# Patient Record
Sex: Female | Born: 1975 | Race: White | Hispanic: Yes | Marital: Married | Smoking: Never smoker
Health system: Southern US, Community
[De-identification: ages and names within clinical notes are randomized; demographics above are authoritative.]

## PROBLEM LIST (undated history)

## (undated) ENCOUNTER — Inpatient Hospital Stay (HOSPITAL_COMMUNITY): Payer: Self-pay

## (undated) DIAGNOSIS — N883 Incompetence of cervix uteri: Secondary | ICD-10-CM

## (undated) DIAGNOSIS — J302 Other seasonal allergic rhinitis: Secondary | ICD-10-CM

## (undated) HISTORY — PX: CHOLECYSTECTOMY: SHX55

---

## 2008-05-31 ENCOUNTER — Emergency Department (HOSPITAL_COMMUNITY): Admission: EM | Admit: 2008-05-31 | Discharge: 2008-05-31 | Payer: Self-pay | Admitting: Emergency Medicine

## 2010-01-28 ENCOUNTER — Ambulatory Visit: Payer: Self-pay | Admitting: Gastroenterology

## 2010-01-28 ENCOUNTER — Inpatient Hospital Stay (HOSPITAL_COMMUNITY): Admission: EM | Admit: 2010-01-28 | Discharge: 2010-01-29 | Payer: Self-pay | Admitting: Emergency Medicine

## 2010-01-29 ENCOUNTER — Ambulatory Visit: Payer: Self-pay | Admitting: Internal Medicine

## 2010-05-05 ENCOUNTER — Inpatient Hospital Stay (HOSPITAL_COMMUNITY): Admission: EM | Admit: 2010-05-05 | Discharge: 2010-05-07 | Payer: Self-pay | Admitting: Emergency Medicine

## 2010-05-06 ENCOUNTER — Encounter (INDEPENDENT_AMBULATORY_CARE_PROVIDER_SITE_OTHER): Payer: Self-pay | Admitting: General Surgery

## 2010-06-02 ENCOUNTER — Emergency Department (HOSPITAL_COMMUNITY): Admission: EM | Admit: 2010-06-02 | Discharge: 2010-06-02 | Payer: Self-pay | Admitting: Emergency Medicine

## 2010-06-15 ENCOUNTER — Emergency Department (HOSPITAL_COMMUNITY): Admission: EM | Admit: 2010-06-15 | Discharge: 2010-06-15 | Payer: Self-pay | Admitting: Emergency Medicine

## 2010-11-17 LAB — BASIC METABOLIC PANEL
Calcium: 8.9 mg/dL (ref 8.4–10.5)
Chloride: 107 mEq/L (ref 96–112)
Creatinine, Ser: 0.52 mg/dL (ref 0.4–1.2)
GFR calc non Af Amer: >60 mL/min (ref >60)
Glucose, Bld: 81 mg/dL (ref 70–99)
Potassium: 3.7 mEq/L (ref 3.5–5.1)

## 2010-11-17 LAB — CBC
HCT: 35.9 % — ABNORMAL LOW (ref 36.0–46.0)
HCT: 38.4 % (ref 36.0–46.0)
Hemoglobin: 11.9 g/dL — ABNORMAL LOW (ref 12.0–15.0)
Hemoglobin: 13 g/dL (ref 12.0–15.0)
MCH: 28.6 pg (ref 26.0–34.0)
MCH: 28.9 pg (ref 26.0–34.0)
MCV: 86.2 fL (ref 78.0–100.0)
MCV: 85.6 fL (ref 78.0–100.0)
MCV: 86.8 fL (ref 78.0–100.0)
Platelets: 255 10*3/uL (ref 150–400)
RBC: 4.13 MIL/uL (ref 3.87–5.11)
WBC: 10.9 10*3/uL — ABNORMAL HIGH (ref 4.0–10.5)
WBC: 9.8 10*3/uL (ref 4.0–10.5)

## 2010-11-17 LAB — URINALYSIS, ROUTINE W REFLEX MICROSCOPIC
Bilirubin Urine: NEGATIVE
Glucose, UA: NEGATIVE mg/dL
Hgb urine dipstick: NEGATIVE
Ketones, ur: NEGATIVE mg/dL
pH: 5.5 (ref 5.0–8.0)

## 2010-11-17 LAB — COMPREHENSIVE METABOLIC PANEL
ALT: 15 U/L (ref 0–35)
AST: 14 U/L (ref 0–37)
AST: 20 U/L (ref 0–37)
Alkaline Phosphatase: 53 U/L (ref 39–117)
Alkaline Phosphatase: 50 U/L (ref 39–117)
BUN: 11 mg/dL (ref 6–23)
CO2: 23 mEq/L (ref 19–32)
CO2: 22 mEq/L (ref 19–32)
Calcium: 9 mg/dL (ref 8.4–10.5)
Chloride: 108 mEq/L (ref 96–112)
Creatinine, Ser: 0.45 mg/dL (ref 0.4–1.2)
Creatinine, Ser: 0.48 mg/dL (ref 0.4–1.2)
GFR calc non Af Amer: >60 mL/min (ref >60)
Potassium: 3.3 mEq/L — ABNORMAL LOW (ref 3.5–5.1)
Total Bilirubin: 0.7 mg/dL (ref 0.3–1.2)
Total Bilirubin: 0.4 mg/dL (ref 0.3–1.2)
Total Protein: 7.6 g/dL (ref 6.0–8.3)

## 2010-11-17 LAB — DIFFERENTIAL
Basophils Absolute: 0.2 10*3/uL — ABNORMAL HIGH (ref 0.0–0.1)
Basophils Relative: 1 % (ref 0–1)
Eosinophils Absolute: 0.2 10*3/uL (ref 0.0–0.7)
Eosinophils Relative: 3 % (ref 0–5)
Lymphocytes Relative: 30 % (ref 12–46)
Lymphocytes Relative: 43 % (ref 12–46)
Lymphs Abs: 3.7 10*3/uL (ref 0.7–4.0)
Lymphs Abs: 3.8 10*3/uL (ref 0.7–4.0)
Lymphs Abs: 4.7 10*3/uL — ABNORMAL HIGH (ref 0.7–4.0)
Monocytes Absolute: 0.5 10*3/uL (ref 0.1–1.0)
Monocytes Relative: 4 % (ref 3–12)
Monocytes Relative: 4 % (ref 3–12)
Monocytes Relative: 4 % (ref 3–12)
Neutro Abs: 8 10*3/uL — ABNORMAL HIGH (ref 1.7–7.7)
Neutrophils Relative %: 50 % (ref 43–77)
Neutrophils Relative %: 55 % (ref 43–77)

## 2010-11-17 LAB — PREGNANCY, URINE: Preg Test, Ur: NEGATIVE

## 2010-11-17 LAB — MRSA PCR SCREENING: MRSA by PCR: NEGATIVE

## 2010-11-17 LAB — POCT PREGNANCY, URINE: Preg Test, Ur: NEGATIVE

## 2010-11-21 LAB — COMPREHENSIVE METABOLIC PANEL
AST: 766 U/L — ABNORMAL HIGH (ref 0–37)
Albumin: 3.2 g/dL — ABNORMAL LOW (ref 3.5–5.2)
Albumin: 4.1 g/dL (ref 3.5–5.2)
BUN: 7 mg/dL (ref 6–23)
Chloride: 109 mEq/L (ref 96–112)
Creatinine, Ser: 0.6 mg/dL (ref 0.4–1.2)
Glucose, Bld: 105 mg/dL — ABNORMAL HIGH (ref 70–99)
Sodium: 139 mEq/L (ref 135–145)
Total Bilirubin: 0.5 mg/dL (ref 0.3–1.2)
Total Bilirubin: 1 mg/dL (ref 0.3–1.2)
Total Protein: 5.7 g/dL — ABNORMAL LOW (ref 6.0–8.3)

## 2010-11-21 LAB — ANA: Anti Nuclear Antibody(ANA): NEGATIVE

## 2010-11-21 LAB — WET PREP, GENITAL: Yeast Wet Prep HPF POC: NONE SEEN

## 2010-11-21 LAB — CBC
HCT: 32.1 % — ABNORMAL LOW (ref 36.0–46.0)
Hemoglobin: 14.5 g/dL (ref 12.0–15.0)
MCHC: 33.8 g/dL (ref 30.0–36.0)
MCV: 84.9 fL (ref 78.0–100.0)
MCV: 86.3 fL (ref 78.0–100.0)
Platelets: 238 10*3/uL (ref 150–400)
RBC: 5.04 MIL/uL (ref 3.87–5.11)
RDW: 14 % (ref 11.5–15.5)
RDW: 14.1 % (ref 11.5–15.5)
WBC: 16.7 10*3/uL — ABNORMAL HIGH (ref 4.0–10.5)

## 2010-11-21 LAB — URINALYSIS, ROUTINE W REFLEX MICROSCOPIC
Bilirubin Urine: NEGATIVE
Nitrite: NEGATIVE
Specific Gravity, Urine: 1.01 (ref 1.005–1.030)
Urobilinogen, UA: 0.2 mg/dL (ref 0.0–1.0)

## 2010-11-21 LAB — CULTURE, BLOOD (ROUTINE X 2)
Report Status: 6012011
Report Status: 6012011

## 2010-11-21 LAB — URINE MICROSCOPIC-ADD ON

## 2010-11-21 LAB — DIFFERENTIAL
Basophils Absolute: 0 10*3/uL (ref 0.0–0.1)
Lymphocytes Relative: 23 % (ref 12–46)
Lymphocytes Relative: 8 % — ABNORMAL LOW (ref 12–46)
Monocytes Absolute: 0.2 10*3/uL (ref 0.1–1.0)
Monocytes Absolute: 0.6 10*3/uL (ref 0.1–1.0)
Monocytes Relative: 4 % (ref 3–12)
Neutro Abs: 15.1 10*3/uL — ABNORMAL HIGH (ref 1.7–7.7)
Neutro Abs: 9.8 10*3/uL — ABNORMAL HIGH (ref 1.7–7.7)
Neutrophils Relative %: 72 % (ref 43–77)
Neutrophils Relative %: 91 % — ABNORMAL HIGH (ref 43–77)

## 2010-11-21 LAB — HEPATITIS PANEL, ACUTE
HCV Ab: NEGATIVE
Hep A IgM: NEGATIVE
Hep B C IgM: NEGATIVE
Hepatitis B Surface Ag: NEGATIVE

## 2010-11-21 LAB — ACETAMINOPHEN LEVEL: Acetaminophen (Tylenol), Serum: <10 ug/mL — ABNORMAL LOW (ref 10–30)

## 2010-11-21 LAB — FERRITIN: Ferritin: 85 ng/mL (ref 10–291)

## 2010-11-21 LAB — IRON AND TIBC: Iron: 119 ug/dL (ref 42–135)

## 2010-11-21 LAB — HEMOGLOBIN A1C
Hgb A1c MFr Bld: 5.4 % (ref <5.7)
Mean Plasma Glucose: 108 mg/dL (ref <117)

## 2010-11-21 LAB — PREGNANCY, URINE: Preg Test, Ur: NEGATIVE

## 2011-07-23 ENCOUNTER — Other Ambulatory Visit: Payer: Self-pay

## 2011-07-23 ENCOUNTER — Emergency Department (HOSPITAL_COMMUNITY)
Admission: EM | Admit: 2011-07-23 | Discharge: 2011-07-23 | Disposition: A | Discharge status: Home / Self Care | Payer: Self-pay | Attending: Emergency Medicine | Admitting: Emergency Medicine

## 2011-07-23 DIAGNOSIS — F1010 Alcohol abuse, uncomplicated: Secondary | ICD-10-CM | POA: Insufficient documentation

## 2011-07-23 DIAGNOSIS — R112 Nausea with vomiting, unspecified: Secondary | ICD-10-CM | POA: Insufficient documentation

## 2011-07-23 HISTORY — DX: Other seasonal allergic rhinitis: J30.2

## 2011-07-23 MED ORDER — SODIUM CHLORIDE 0.9 % IV BOLUS (SEPSIS)
1000.0000 mL | Freq: Once | INTRAVENOUS | Status: AC
  Administered 2011-07-23: 1000 mL via INTRAVENOUS

## 2011-07-23 MED ORDER — ONDANSETRON HCL 4 MG/2ML IJ SOLN
4.0000 mg | Freq: Once | INTRAMUSCULAR | Status: AC
  Administered 2011-07-23: 4 mg via INTRAVENOUS
  Filled 2011-07-23: qty 2

## 2011-07-23 MED ORDER — PANTOPRAZOLE SODIUM 40 MG IV SOLR
40.0000 mg | Freq: Once | INTRAVENOUS | Status: AC
  Administered 2011-07-23: 40 mg via INTRAVENOUS
  Filled 2011-07-23: qty 40

## 2011-07-23 MED ORDER — SODIUM CHLORIDE 0.9 % IV SOLN
Freq: Once | INTRAVENOUS | Status: AC
  Administered 2011-07-23: 05:00:00 via INTRAVENOUS

## 2011-07-23 MED ORDER — PROMETHAZINE HCL 25 MG PO TABS: 12.5000 mg | ORAL_TABLET | Freq: Four times a day (QID) | ORAL | Status: AC | PRN

## 2011-07-23 NOTE — ED Notes (Signed)
Pt sleeping. 

## 2011-07-23 NOTE — ED Provider Notes (Signed)
History     CSN: [id number] Arrival date & time: 07/23/2011  4:23 AM   First MD Initiated Contact with Patient 07/23/11 0451      Chief Complaint  Patient presents with  . Emesis    pressure on throat and chest    (Consider location/radiation/quality/duration/timing/severity/associated sxs/prior treatment) HPI Comments: Leslie Braun is a 35 y.o. female who presents to the Emergency Department complaining of vomiting. Patient drinks every Saturday night. Tonight she drank 8-10 beers and began to feel sick on her stomach. Began vomiting and has been unable to stop. Now with central chest pain that radiates up to his neck and throat. Patient is tearful.  Patient is a 35 y.o. female presenting with vomiting. The history is provided by the patient.  Emesis  This is a new problem. The current episode started 3 to 5 hours ago. The problem occurs 5 to 10 times per day. The problem has not changed since onset.The emesis has an appearance of stomach contents. There has been no fever. Pertinent negatives include no abdominal pain, no chills and no fever.    Past Medical History  Diagnosis Date  . Seasonal allergies     Past Surgical History  Procedure Date  . Cholecystectomy     History reviewed. No pertinent family history.  History  Substance Use Topics  . Smoking status: Never Smoker   . Smokeless tobacco: Not on file  . Alcohol Use: Yes     heavy    OB History    Grav Para Term Preterm Abortions TAB SAB Ect Mult Living                  Review of Systems  Constitutional: Negative for fever and chills.  Gastrointestinal: Positive for nausea and vomiting. Negative for abdominal pain.  Psychiatric/Behavioral:       Tearful  All other systems reviewed and are negative.    Allergies  Review of patient's allergies indicates no known allergies.  Home Medications  No current outpatient prescriptions on file.  BP 128/103  Pulse 102  Temp(Src) 97.7 F (36.5 C) (Oral)   Resp 22  Ht 5\' 3"  (1.6 m)  Wt 200 lb (90.719 kg)  BMI 35.43 kg/m2  SpO2 100%  LMP 06/19/2011  Physical Exam  Nursing note and vitals reviewed. Constitutional: She is oriented to person, place, and time. She appears well-developed and well-nourished. She appears distressed.       intoxicated  HENT:  Head: Normocephalic and atraumatic.  Right Ear: External ear normal.  Left Ear: External ear normal.  Mouth/Throat: Oropharynx is clear and moist.  Eyes: EOM are normal.  Neck: Normal range of motion.  Cardiovascular: Normal rate, normal heart sounds and intact distal pulses.   Pulmonary/Chest: Effort normal and breath sounds normal.  Abdominal: Soft. Bowel sounds are normal. She exhibits no distension. There is no tenderness. There is no rebound and no guarding.  Musculoskeletal: Normal range of motion.  Neurological: She is oriented to person, place, and time.  Skin: Skin is warm and dry.  Psychiatric:       tearful    ED Course  Procedures (including critical care time)  Date: 07/23/2011  0439  Rate: 95  Rhythm: normal sinus rhythm  QRS Axis: normal  Intervals: normal  ST/T Wave abnormalities: normal  Conduction Disutrbances:none  Narrative Interpretation:   Old EKG Reviewed: none available     MDM  Patient with nausea and vomiting after consuming alcohol. Received IVF, has taken PO  fluids. Nausea and vomiting resolved. Pt feels improved after observation and/or treatment in ED.The patient appears reasonably screened and/or stabilized for discharge and I doubt any other medical condition or other [facility] requiring further screening, evaluation, or treatment in the ED at this time prior to discharge.  MDM Reviewed: nursing note and vitals Interpretation: ECG          Nicoletta Dress. Colon Branch, MD 07/23/11 [phone]

## 2011-07-23 NOTE — ED Notes (Signed)
2nd liter infusing now

## 2011-07-23 NOTE — ED Notes (Signed)
Was out drinking at least 8 beers last night, has been vomiting since and began having "pressure on chest and throat closing up for 1 hour"

## 2011-07-23 NOTE — ED Notes (Signed)
Pt shivering, warm fluids given

## 2011-07-23 NOTE — ED Notes (Signed)
Pt also given warm blankets

## 2012-09-04 NOTE — L&D Delivery Note (Signed)
Delivery Note At 3:18 AM a viable female was delivered via Vaginal, Spontaneous Delivery (Presentation: Right Occiput Anterior).  APGAR: infant crying at perineum; weight .   Placenta status: Intact, Spontaneous.  Cord: 3 vessels with the following complications: None.    Anesthesia: None  Episiotomy: None Lacerations: None Suture Repair: NA Est. Blood Loss (mL): 200  Mom to postpartum.  Baby to NICU.  I was called to evaluate pt in Ante with worsening pain from MAU. Pt was writhing in bed q74minuts with poor toco tracing. Fetus was tolerating. Pt at that time was 8/90/+1. Pt urgently tranferred to L&D, and on recheck pt was complete. NICU was notified, once in the room pushed with pt. 2 pushes delivered. NSVD over intact perineum. Cord clamped, cut and handed to waiting NICU team. 3v cord and intact placenta delivered with active management of 3rd stage with pit and traction. No tears appreciated. Firm uterus, hemostatic. EBL 200cc. Infant with NICU. Mother likely to 3rd floor to be closer to infant.  1 dose of ampicillin in prior to delivery. Counts correct prior to leaving room    Leslie Braun 07/01/2013, 3:33 AM

## 2013-04-22 ENCOUNTER — Observation Stay (HOSPITAL_COMMUNITY)
Admission: AD | Admit: 2013-04-22 | Discharge: 2013-04-23 | Disposition: A | Discharge status: Home / Self Care | Payer: Medicaid Other | Source: Ambulatory Visit | Attending: Obstetrics & Gynecology | Admitting: Obstetrics & Gynecology

## 2013-04-22 ENCOUNTER — Encounter (HOSPITAL_COMMUNITY): Payer: Self-pay

## 2013-04-22 DIAGNOSIS — N390 Urinary tract infection, site not specified: Secondary | ICD-10-CM | POA: Insufficient documentation

## 2013-04-22 DIAGNOSIS — O2390 Unspecified genitourinary tract infection in pregnancy, unspecified trimester: Secondary | ICD-10-CM | POA: Insufficient documentation

## 2013-04-22 DIAGNOSIS — O09529 Supervision of elderly multigravida, unspecified trimester: Secondary | ICD-10-CM | POA: Insufficient documentation

## 2013-04-22 DIAGNOSIS — O2342 Unspecified infection of urinary tract in pregnancy, second trimester: Secondary | ICD-10-CM

## 2013-04-22 DIAGNOSIS — O209 Hemorrhage in early pregnancy, unspecified: Principal | ICD-10-CM | POA: Insufficient documentation

## 2013-04-22 DIAGNOSIS — O2340 Unspecified infection of urinary tract in pregnancy, unspecified trimester: Secondary | ICD-10-CM | POA: Diagnosis present

## 2013-04-22 DIAGNOSIS — N888 Other specified noninflammatory disorders of cervix uteri: Secondary | ICD-10-CM

## 2013-04-22 DIAGNOSIS — O4692 Antepartum hemorrhage, unspecified, second trimester: Secondary | ICD-10-CM

## 2013-04-22 DIAGNOSIS — O4690 Antepartum hemorrhage, unspecified, unspecified trimester: Secondary | ICD-10-CM | POA: Diagnosis present

## 2013-04-22 DIAGNOSIS — Z3689 Encounter for other specified antenatal screening: Secondary | ICD-10-CM

## 2013-04-22 DIAGNOSIS — O26879 Cervical shortening, unspecified trimester: Secondary | ICD-10-CM | POA: Insufficient documentation

## 2013-04-22 DIAGNOSIS — O3432 Maternal care for cervical incompetence, second trimester: Secondary | ICD-10-CM

## 2013-04-22 LAB — URINALYSIS, ROUTINE W REFLEX MICROSCOPIC
Hgb urine dipstick: NEGATIVE
Protein, ur: NEGATIVE mg/dL
Specific Gravity, Urine: >1.03 — ABNORMAL HIGH (ref 1.005–1.030)
Urobilinogen, UA: 0.2 mg/dL (ref 0.0–1.0)

## 2013-04-22 LAB — POCT PREGNANCY, URINE: Preg Test, Ur: POSITIVE — AB

## 2013-04-22 LAB — URINE MICROSCOPIC-ADD ON

## 2013-04-22 NOTE — MAU Note (Signed)
Pt states she also has a headache. Has not taken anything for it

## 2013-04-22 NOTE — MAU Note (Signed)
States she has not had a period in 4 months, about one month ago, pt had +hpt. At 400 am, pt had some vaginal bleeding when she wipes. Denies abdominal pain.

## 2013-04-22 NOTE — MAU Provider Note (Addendum)
Chief Complaint: Vaginal Bleeding and Headache  Chief Complaint: Vaginal Bleeding and Headache  First Provider Initiated Contact with Patient 04/23/13 0015     SUBJECTIVE HPI: Leslie Braun is a 37 y.o. G1P0 at 17 weeks 6 days by LMP who presents with vaginal bleeding with wiping today. Positive home pregnancy tests one month ago. No other testing this pregnancy.  Past Medical History  Diagnosis Date  . Seasonal allergies    OB History  Gravida Para Term Preterm AB SAB TAB Ectopic Multiple Living  3 2 2       2     # Outcome Date GA Lbr Len/2nd Weight Sex Delivery Anes PTL Lv  3 CUR           2 TRM      SVD   Y  1 TRM      SVD   Y     Past Surgical History  Procedure Laterality Date  . Cholecystectomy     History   Social History  . Marital Status: Married    Spouse Name: N/A    Number of Children: N/A  . Years of Education: N/A   Occupational History  . Not on file.   Social History Main Topics  . Smoking status: Never Smoker   . Smokeless tobacco: Not on file  . Alcohol Use: Yes     Comment: heavy  . Drug Use: No  . Sexual Activity: Yes    Birth Control/ Protection: None   Other Topics Concern  . Not on file   Social History Narrative  . No narrative on file   No current facility-administered medications on file prior to encounter.   No current outpatient prescriptions on file prior to encounter.   No Known Allergies  ROS: Positive for spotting, frequency of urination. Negative for fever, chills, vaginal discharge, abdominal pain, dysuria, urgency, flank pain, nausea, vomiting, diarrhea, constipation or passage of tissue.  OBJECTIVE Blood pressure 123/50, pulse 80, temperature 98.7 F (37.1 C), temperature source Oral, resp. rate 18, last menstrual period 12/17/2012, SpO2 100.00%. GENERAL: Well-developed, well-nourished female in no acute distress.  HEENT: Normocephalic HEART: normal rate RESP: normal effort ABDOMEN: Soft, non-tender. Fundal height  20 weeks. No CVA tenderness. EXTREMITIES: Nontender, no edema NEURO: Alert and oriented SPECULUM EXAM: NEFG, physiologic discharge, scant tan blood noted, cervix friable,appears closed. No mucopurulent discharge. Bimanual exam deferred do to unknown placentation. Fetal heart rate 152 by Doppler.  LAB RESULTS Results for orders placed during the hospital encounter of 04/22/13 (from the past 24 hour(s))  URINALYSIS, ROUTINE W REFLEX MICROSCOPIC     Status: Abnormal   Collection Time    04/22/13 11:35 PM      Result Value Range   Color, Urine YELLOW  YELLOW   APPearance CLEAR  CLEAR   Specific Gravity, Urine >1.030 (*) 1.005 - 1.030   pH 6.5  5.0 - 8.0   Glucose, UA NEGATIVE  NEGATIVE mg/dL   Hgb urine dipstick NEGATIVE  NEGATIVE   Bilirubin Urine NEGATIVE  NEGATIVE   Ketones, ur 15 (*) NEGATIVE mg/dL   Protein, ur NEGATIVE  NEGATIVE mg/dL   Urobilinogen, UA 0.2  0.0 - 1.0 mg/dL   Nitrite POSITIVE (*) NEGATIVE   Leukocytes, UA TRACE (*) NEGATIVE  URINE MICROSCOPIC-ADD ON     Status: Abnormal   Collection Time    04/22/13 11:35 PM      Result Value Range   Squamous Epithelial / LPF RARE  RARE   WBC,  UA 0-2  <3 WBC/hpf   RBC / HPF 0-2  <3 RBC/hpf   Bacteria, UA FEW (*) RARE   Crystals CA OXALATE CRYSTALS (*) NEGATIVE  POCT PREGNANCY, URINE     Status: Abnormal   Collection Time    04/22/13 11:37 PM      Result Value Range   Preg Test, Ur POSITIVE (*) NEGATIVE  ABO/RH     Status: None   Collection Time    04/23/13 12:20 AM      Result Value Range   ABO/RH(D) A POS    CBC WITH DIFFERENTIAL     Status: Abnormal   Collection Time    04/23/13 12:20 AM      Result Value Range   WBC 8.1  4.0 - 10.5 K/uL   RBC 3.51 (*) 3.87 - 5.11 MIL/uL   Hemoglobin 9.9 (*) 12.0 - 15.0 g/dL   HCT 09.8 (*) 11.9 - 14.7 %   MCV 82.9  78.0 - 100.0 fL   MCH 28.2  26.0 - 34.0 pg   MCHC 34.0  30.0 - 36.0 g/dL   RDW 82.9  56.2 - 13.0 %   Platelets 240  150 - 400 K/uL   Neutrophils Relative % 58   43 - 77 %   Neutro Abs 4.7  1.7 - 7.7 K/uL   Lymphocytes Relative 33  12 - 46 %   Lymphs Abs 2.6  0.7 - 4.0 K/uL   Monocytes Relative 5  3 - 12 %   Monocytes Absolute 0.4  0.1 - 1.0 K/uL   Eosinophils Relative 4  0 - 5 %   Eosinophils Absolute 0.3  0.0 - 0.7 K/uL   Basophils Relative 0  0 - 1 %   Basophils Absolute 0.0  0.0 - 0.1 K/uL  WET PREP, GENITAL     Status: Abnormal   Collection Time    04/23/13 12:26 AM      Result Value Range   Yeast Wet Prep HPF POC NONE SEEN  NONE SEEN   Trich, Wet Prep NONE SEEN  NONE SEEN   Clue Cells Wet Prep HPF POC FEW (*) NONE SEEN   WBC, Wet Prep HPF POC MODERATE (*) NONE SEEN   IMAGING No results found.  MAU COURSE  ASSESSMENT 1. Vaginal bleeding in pregnancy, second trimester   2. UTI in pregnancy, antepartum, second trimester   3. Cervix funneling complicating pregnancy, second trimester    PLAN Observe on third floor per consult with Dr. Otho Perl and Dr. Macon Large. Progesterone per vagina Macrobid. Urine culture and GC Chlamydia cultures pending. OB panel ordered.  [place], CNM 04/23/2013  2:51 AM    Attestation of Attending Supervision of Advanced Practitioner (PA/CNM/NP): Evaluation and management procedures were performed by the Advanced Practitioner under my supervision and collaboration.  I have reviewed the Advanced Practitioner's note and chart, and I agree with the management and plan.  [name], MD, FACOG Attending Obstetrician & Gynecologist Faculty Practice, [facility]

## 2013-04-23 ENCOUNTER — Observation Stay (HOSPITAL_COMMUNITY): Payer: Medicaid Other

## 2013-04-23 ENCOUNTER — Other Ambulatory Visit (HOSPITAL_COMMUNITY): Payer: Self-pay

## 2013-04-23 ENCOUNTER — Inpatient Hospital Stay (HOSPITAL_COMMUNITY): Payer: Medicaid Other

## 2013-04-23 ENCOUNTER — Encounter (HOSPITAL_COMMUNITY): Payer: Self-pay | Admitting: Advanced Practice Midwife

## 2013-04-23 DIAGNOSIS — O4690 Antepartum hemorrhage, unspecified, unspecified trimester: Secondary | ICD-10-CM

## 2013-04-23 DIAGNOSIS — O26879 Cervical shortening, unspecified trimester: Secondary | ICD-10-CM | POA: Diagnosis present

## 2013-04-23 DIAGNOSIS — O2340 Unspecified infection of urinary tract in pregnancy, unspecified trimester: Secondary | ICD-10-CM | POA: Diagnosis present

## 2013-04-23 LAB — CBC WITH DIFFERENTIAL/PLATELET
Eosinophils Relative: 4 % (ref 0–5)
Hemoglobin: 9.9 g/dL — ABNORMAL LOW (ref 12.0–15.0)
Lymphocytes Relative: 33 % (ref 12–46)
Lymphs Abs: 2.6 10*3/uL (ref 0.7–4.0)
MCV: 82.9 fL (ref 78.0–100.0)
Monocytes Relative: 5 % (ref 3–12)
Neutrophils Relative %: 58 % (ref 43–77)
Platelets: 240 10*3/uL (ref 150–400)
RBC: 3.51 MIL/uL — ABNORMAL LOW (ref 3.87–5.11)
WBC: 8.1 10*3/uL (ref 4.0–10.5)

## 2013-04-23 LAB — GC/CHLAMYDIA PROBE AMP
CT Probe RNA: NEGATIVE
GC Probe RNA: NEGATIVE

## 2013-04-23 LAB — RUBELLA SCREEN: Rubella: 5.86 Index — ABNORMAL HIGH (ref <0.90)

## 2013-04-23 LAB — RPR: RPR Ser Ql: NONREACTIVE

## 2013-04-23 LAB — WET PREP, GENITAL: Trich, Wet Prep: NONE SEEN

## 2013-04-23 MED ORDER — NITROFURANTOIN MONOHYD MACRO 100 MG PO CAPS
100.0000 mg | ORAL_CAPSULE | Freq: Two times a day (BID) | ORAL | Status: DC
  Administered 2013-04-23 (×2): 100 mg via ORAL
  Filled 2013-04-23 (×4): qty 1

## 2013-04-23 MED ORDER — DSS 100 MG PO CAPS: 100.0000 mg | ORAL_CAPSULE | Freq: Every day | ORAL | Status: DC

## 2013-04-23 MED ORDER — CALCIUM CARBONATE ANTACID 500 MG PO CHEW: 2.0000 | CHEWABLE_TABLET | ORAL | Status: DC | PRN

## 2013-04-23 MED ORDER — ZOLPIDEM TARTRATE 5 MG PO TABS: 5.0000 mg | ORAL_TABLET | Freq: Every evening | ORAL | Status: DC | PRN

## 2013-04-23 MED ORDER — PRENATAL MULTIVITAMIN CH: 1.0000 | ORAL_TABLET | Freq: Every day | ORAL | Status: DC

## 2013-04-23 MED ORDER — DOCUSATE SODIUM 100 MG PO CAPS
100.0000 mg | ORAL_CAPSULE | Freq: Every day | ORAL | Status: DC
  Administered 2013-04-23: 100 mg via ORAL
  Filled 2013-04-23: qty 1

## 2013-04-23 MED ORDER — PROGESTERONE MICRONIZED 200 MG PO CAPS
200.0000 mg | ORAL_CAPSULE | Freq: Every day | ORAL | Status: DC
  Filled 2013-04-23: qty 1

## 2013-04-23 MED ORDER — ACETAMINOPHEN 325 MG PO TABS
650.0000 mg | ORAL_TABLET | ORAL | Status: DC | PRN
  Administered 2013-04-23: 650 mg via ORAL
  Filled 2013-04-23: qty 2

## 2013-04-23 MED ORDER — NITROFURANTOIN MONOHYD MACRO 100 MG PO CAPS: 100.0000 mg | ORAL_CAPSULE | Freq: Two times a day (BID) | ORAL | Status: DC

## 2013-04-23 MED ORDER — CEPHALEXIN 500 MG PO CAPS: 500.0000 mg | ORAL_CAPSULE | Freq: Four times a day (QID) | ORAL | Status: AC

## 2013-04-23 MED ORDER — PROGESTERONE MICRONIZED 200 MG PO CAPS: 200.0000 mg | ORAL_CAPSULE | Freq: Every day | ORAL | Status: DC

## 2013-04-23 MED ORDER — PRENATAL MULTIVITAMIN CH
1.0000 | ORAL_TABLET | Freq: Every day | ORAL | Status: DC
  Administered 2013-04-23: 1 via ORAL
  Filled 2013-04-23: qty 1

## 2013-04-23 NOTE — Progress Notes (Signed)
Pt is discharged in the care of husband via wheelchair. Discharge instructions with rx were given to pt. Questions were asked and answered. Denies any vaginal bleeding,pain or discomfort. Stable.

## 2013-04-23 NOTE — H&P (Signed)
Chief Complaint: Vaginal Bleeding and Headache  Chief Complaint: Vaginal Bleeding and Headache  First Provider Initiated Contact with Patient 04/23/13 0015     SUBJECTIVE HPI: Leslie Braun is a 37 y.o. G1P0 at 17 weeks 6 days by LMP who presents with vaginal bleeding with wiping today. Positive home pregnancy tests one month ago. No other testing this pregnancy.  Past Medical History  Diagnosis Date  . Seasonal allergies    OB History  Gravida Para Term Preterm AB SAB TAB Ectopic Multiple Living  3 2 2       2     # Outcome Date GA Lbr Len/2nd Weight Sex Delivery Anes PTL Lv  3 CUR           2 TRM      SVD   Y  1 TRM      SVD   Y     Past Surgical History  Procedure Laterality Date  . Cholecystectomy     History   Social History  . Marital Status: Married    Spouse Name: N/A    Number of Children: N/A  . Years of Education: N/A   Occupational History  . Not on file.   Social History Main Topics  . Smoking status: Never Smoker   . Smokeless tobacco: Not on file  . Alcohol Use: Yes     Comment: heavy  . Drug Use: No  . Sexual Activity: Yes    Birth Control/ Protection: None   Other Topics Concern  . Not on file   Social History Narrative  . No narrative on file   No current facility-administered medications on file prior to encounter.   No current outpatient prescriptions on file prior to encounter.   No Known Allergies  ROS: Positive for spotting, frequency of urination. Negative for fever, chills, vaginal discharge, abdominal pain, dysuria, urgency, flank pain, nausea, vomiting, diarrhea, constipation or passage of tissue.  OBJECTIVE Blood pressure 123/50, pulse 80, temperature 98.7 F (37.1 C), temperature source Oral, resp. rate 18, last menstrual period 12/17/2012, SpO2 100.00%. GENERAL: Well-developed, well-nourished female in no acute distress.  HEENT: Normocephalic HEART: normal rate RESP: normal effort ABDOMEN: Soft, non-tender. Fundal height  20 weeks. No CVA tenderness. EXTREMITIES: Nontender, no edema NEURO: Alert and oriented SPECULUM EXAM: NEFG, physiologic discharge, scant tan blood noted, cervix friable,appears closed. No mucopurulent discharge. Bimanual exam deferred due to unknown placentation. Fetal heart rate 152 by Doppler.  LAB RESULTS Results for orders placed during the hospital encounter of 04/22/13 (from the past 24 hour(s))  URINALYSIS, ROUTINE W REFLEX MICROSCOPIC     Status: Abnormal   Collection Time    04/22/13 11:35 PM      Result Value Range   Color, Urine YELLOW  YELLOW   APPearance CLEAR  CLEAR   Specific Gravity, Urine >1.030 (*) 1.005 - 1.030   pH 6.5  5.0 - 8.0   Glucose, UA NEGATIVE  NEGATIVE mg/dL   Hgb urine dipstick NEGATIVE  NEGATIVE   Bilirubin Urine NEGATIVE  NEGATIVE   Ketones, ur 15 (*) NEGATIVE mg/dL   Protein, ur NEGATIVE  NEGATIVE mg/dL   Urobilinogen, UA 0.2  0.0 - 1.0 mg/dL   Nitrite POSITIVE (*) NEGATIVE   Leukocytes, UA TRACE (*) NEGATIVE  URINE MICROSCOPIC-ADD ON     Status: Abnormal   Collection Time    04/22/13 11:35 PM      Result Value Range   Squamous Epithelial / LPF RARE  RARE   WBC,  UA 0-2  <3 WBC/hpf   RBC / HPF 0-2  <3 RBC/hpf   Bacteria, UA FEW (*) RARE   Crystals CA OXALATE CRYSTALS (*) NEGATIVE  POCT PREGNANCY, URINE     Status: Abnormal   Collection Time    04/22/13 11:37 PM      Result Value Range   Preg Test, Ur POSITIVE (*) NEGATIVE  ABO/RH     Status: None   Collection Time    04/23/13 12:20 AM      Result Value Range   ABO/RH(D) A POS    CBC WITH DIFFERENTIAL     Status: Abnormal   Collection Time    04/23/13 12:20 AM      Result Value Range   WBC 8.1  4.0 - 10.5 K/uL   RBC 3.51 (*) 3.87 - 5.11 MIL/uL   Hemoglobin 9.9 (*) 12.0 - 15.0 g/dL   HCT 16.1 (*) 09.6 - 04.5 %   MCV 82.9  78.0 - 100.0 fL   MCH 28.2  26.0 - 34.0 pg   MCHC 34.0  30.0 - 36.0 g/dL   RDW 40.9  81.1 - 91.4 %   Platelets 240  150 - 400 K/uL   Neutrophils Relative % 58   43 - 77 %   Neutro Abs 4.7  1.7 - 7.7 K/uL   Lymphocytes Relative 33  12 - 46 %   Lymphs Abs 2.6  0.7 - 4.0 K/uL   Monocytes Relative 5  3 - 12 %   Monocytes Absolute 0.4  0.1 - 1.0 K/uL   Eosinophils Relative 4  0 - 5 %   Eosinophils Absolute 0.3  0.0 - 0.7 K/uL   Basophils Relative 0  0 - 1 %   Basophils Absolute 0.0  0.0 - 0.1 K/uL  WET PREP, GENITAL     Status: Abnormal   Collection Time    04/23/13 12:26 AM      Result Value Range   Yeast Wet Prep HPF POC NONE SEEN  NONE SEEN   Trich, Wet Prep NONE SEEN  NONE SEEN   Clue Cells Wet Prep HPF POC FEW (*) NONE SEEN   WBC, Wet Prep HPF POC MODERATE (*) NONE SEEN   IMAGING 1.5 cm cervix with funneling, debris in lower uterine segement  MAU COURSE  ASSESSMENT 1. Vaginal bleeding in pregnancy, second trimester   2. UTI in pregnancy, antepartum, second trimester   3. Cervix funneling complicating pregnancy, second trimester    PLAN Observe on third floor per consult with Dr. Otho Perl and Dr. Macon Large. Progesterone per vagina Macrobid. Urine culture and GC Chlamydia cultures pending. OB panel ordered. MFM Consult and detailed anatomy scan in am  [place], [place] 04/23/2013  2:51 AM  Attestation of Attending Supervision of Advanced Practitioner (PA/CNM/NP): Evaluation and management procedures were performed by the Advanced Practitioner under my supervision and collaboration.  I have reviewed the Advanced Practitioner's note and chart, and I agree with the management and plan.  [name], MD, FACOG Attending Obstetrician & Gynecologist Faculty Practice, [facility]

## 2013-04-23 NOTE — Progress Notes (Signed)
UR completed 

## 2013-04-23 NOTE — Discharge Summary (Signed)
Physician Discharge Summary  Patient ID: Leslie Braun MRN: [id number] DOB/AGE: 04-06-1976 37 y.o.  Admit date: 04/22/2013 Discharge date: 04/23/2013  Admission Diagnoses:20 weeks bleeding  Discharge Diagnoses: cervical shortening Active Problems:   Vaginal bleeding in pregnancy   Cervix funneling complicating pregnancy   UTI in pregnancy, antepartum   Discharged Condition: fair  Hospital Course: Bleeding stopped  Consults: [facility] Dr. Claudean Severance  Significant Diagnostic Studies: radiology: Ultrasound: Fetal US  Treatments: Prometrium vaginal  Discharge Exam: Blood pressure 109/55, pulse 79, temperature 97.9 F (36.6 C), temperature source Oral, resp. rate 18, height 5\' 4"  (1.626 m), weight 187 lb 8 oz (85.049 kg), last menstrual period 12/17/2012, SpO2 100.00%. General appearance: alert, cooperative and no distress GI: soft, non-tender; bowel sounds normal; no masses,  no organomegaly and gravid  Disposition: 01-Home or Self Care     Medication List         cephALEXin 500 MG capsule  Commonly known as:  KEFLEX  Take 1 capsule (500 mg total) by mouth 4 (four) times daily.     DSS 100 MG Caps  Take 100 mg by mouth daily.     nitrofurantoin (macrocrystal-monohydrate) 100 MG capsule  Commonly known as:  MACROBID  Take 1 capsule (100 mg total) by mouth every 12 (twelve) hours.     prenatal multivitamin Tabs tablet  Take 1 tablet by mouth daily at 12 noon.     progesterone 200 MG capsule  Commonly known as:  PROMETRIUM  Place 1 capsule (200 mg total) vaginally at bedtime.           Follow-up Information   Follow up with [facility]. (Start prenatal care)    Contact information:   [address] [place] [address] [phone]      Follow up with [facility] [place] MATERNITY ADMISSIONS. (As needed if symptoms worsen)    Contact information:   [address] [id number] [place] [address] [phone]    [facility] at [facility]  Friday at 2:15 PM  Signed: Aracelia Brinson 04/23/2013, 4:54 PM

## 2013-04-23 NOTE — Consult Note (Signed)
Maternal Fetal Medicine Consultation  Requesting Provider(s): [name], MD  Reason for consultation: Shortened cervix at 20 4/7 weeks  HPI: Leslie Braun is a 37 yo G3P2002 currently at 20 4/7 weeks who was admitted last night due to shortened cervix.  The patient reports that she experienced some vaginal spotting and increased vaginal discharge for about 1-2 days.  She has had no additional vaginal spotting since last night.  Her past OB history is remarkable for 2 prior term SVDs without complications. She denies any known risk factors for cervical insufficiency - no history of cervical dysplasia or cervical procedures.  She denies uterine cramping or contractions.  OB History: OB History   Grav Para Term Preterm Abortions TAB SAB Ect Mult Living   3 2 2       2       PMH:  Past Medical History  Diagnosis Date  . Seasonal allergies     PSH:  Past Surgical History  Procedure Laterality Date  . Cholecystectomy     Meds:  Scheduled Meds: . docusate sodium  100 mg Oral Daily  . nitrofurantoin (macrocrystal-monohydrate)  100 mg Oral Q12H  . prenatal multivitamin  1 tablet Oral Q1200  . progesterone  200 mg Vaginal QHS   Continuous Infusions:  PRN Meds:.acetaminophen, calcium carbonate, zolpidem  Allergies: No Known Allergies  FH: denies family history of birth defects or hereditary disorders  Soc: denies tobacco or ETOH use  Review of Systems: no vaginal bleeding or cramping/contractions, no LOF, no nausea/vomiting. All other systems reviewed and are negative.  PE:   Filed Vitals:   04/23/13 1200  BP: 109/55  Pulse: 79  Temp: 97.9 F (36.6 C)  Resp: 18    GEN: well-appearing female ABD: gravid, NT  See ultrasound report in [facility] Cervical length 1.5 cm with U-shaped funneling.  Some intra amniotic cervical debris noted.  Labs: CBC    Component Value Date/Time   WBC 8.1 04/23/2013 0020   RBC 3.51* 04/23/2013 0020   HGB 9.9* 04/23/2013 0020   HCT  29.1* 04/23/2013 0020   PLT 240 04/23/2013 0020   MCV 82.9 04/23/2013 0020   MCH 28.2 04/23/2013 0020   MCHC 34.0 04/23/2013 0020   RDW 13.1 04/23/2013 0020   LYMPHSABS 2.6 04/23/2013 0020   MONOABS 0.4 04/23/2013 0020   EOSABS 0.3 04/23/2013 0020   BASOSABS 0.0 04/23/2013 0020     A/P: 1) Shortened cervix with cervical debris - cervical debris appears to be an independent risk factor for early preterm delivery in addition to shortened cervix.  In the absence of prior preterm birth or other risk factors for cervical insufficiency, the benefit of cerclage is uncertain.  The fact that the patient experienced vaginal bleeding is also somewhat concerning and she may not be a good candidate for cerclage at this time based on this information.  Another option that may be explored in cervical pessary.  Recommend: -continue vaginal progesterone (Prometrium) -Would feel comfortable with hospital discharge on modified bedrest at home -please check GC/CHL prior to hospital discharge.  Wet prep showed clue cells - would also give a course of Flagyl if not already performed -Recommend follow up TVUS with MFM on Friday or early next week - if cervical length stable or improved, would continue current management.  We can explore the possibility of cervical pessary when she returns.   Thank you for the opportunity to be a part of the care of [employer]. Please contact our office if  we can be of further assistance.   I spent approximately 30 minutes with this patient with over 50% of time spent in face-to-face counseling.  Alpha Gula, MD Maternal Fetal Medicine

## 2013-04-23 NOTE — H&P (Signed)
See MAU note.  [place], CNM 04/23/2013 2:52 AM

## 2013-04-24 ENCOUNTER — Other Ambulatory Visit: Payer: Self-pay | Admitting: Obstetrics & Gynecology

## 2013-04-24 DIAGNOSIS — O3430 Maternal care for cervical incompetence, unspecified trimester: Secondary | ICD-10-CM

## 2013-04-25 ENCOUNTER — Ambulatory Visit (HOSPITAL_COMMUNITY)
Admit: 2013-04-25 | Discharge: 2013-04-25 | Disposition: A | Discharge status: Home / Self Care | Payer: Self-pay | Attending: Obstetrics & Gynecology | Admitting: Obstetrics & Gynecology

## 2013-04-25 VITALS — BP 112/58 | HR 98 | Wt 187.0 lb

## 2013-04-25 DIAGNOSIS — Z3689 Encounter for other specified antenatal screening: Secondary | ICD-10-CM | POA: Insufficient documentation

## 2013-04-25 DIAGNOSIS — O26879 Cervical shortening, unspecified trimester: Secondary | ICD-10-CM | POA: Insufficient documentation

## 2013-04-25 DIAGNOSIS — O3430 Maternal care for cervical incompetence, unspecified trimester: Secondary | ICD-10-CM

## 2013-04-25 DIAGNOSIS — O209 Hemorrhage in early pregnancy, unspecified: Secondary | ICD-10-CM | POA: Insufficient documentation

## 2013-04-25 NOTE — Progress Notes (Signed)
Leslie Braun  was seen today for an ultrasound appointment.  See full report in [facility]OB[facility]GY[facility].  Comments: [name] returns for follow up.  She was recently admitted due to shortened cervix.  She has a history of 2 prior term deliveries without complications.  She reported some vaginal bleeding on admission that has since resolved.  She is currently on vaginal progesterone.  Do to vaginal spotting, the patient was not deemed to be a good candidate for physical exam indicated cerclage.  The patient was counseld on cervical pessary - she elected to have a pessary placed - a #3 Cup Pessary was placed without difficulty.  Impression: Single IUP at 20 6/7 weeks Limited ultrasound performed due to shortened cervix  TVUS - cevical length 1.3 cm with U-shaped funneling.  Some intraamniotic debris is again noted. (no significant change from admission)  Recommendations: Recommend follow up next week for cervical length. Unless complications arise, would not remove pessary until 36 weeks, PROM or delivery.  Alpha Gula, MD

## 2013-05-02 ENCOUNTER — Ambulatory Visit (HOSPITAL_COMMUNITY)
Admission: RE | Admit: 2013-05-02 | Discharge: 2013-05-02 | Disposition: A | Discharge status: Home / Self Care | Payer: Self-pay | Source: Ambulatory Visit | Attending: Obstetrics & Gynecology | Admitting: Obstetrics & Gynecology

## 2013-05-02 VITALS — BP 111/54 | HR 69 | Wt 190.5 lb

## 2013-05-02 DIAGNOSIS — O26879 Cervical shortening, unspecified trimester: Secondary | ICD-10-CM | POA: Insufficient documentation

## 2013-05-02 DIAGNOSIS — O3430 Maternal care for cervical incompetence, unspecified trimester: Secondary | ICD-10-CM

## 2013-05-02 DIAGNOSIS — Z3689 Encounter for other specified antenatal screening: Secondary | ICD-10-CM | POA: Insufficient documentation

## 2013-05-02 NOTE — Progress Notes (Signed)
Leslie Braun  was seen today for an ultrasound appointment.  See full report in [facility].  Impression: Single IUP at 21 6/7 weeks Limited ultrasound performed due to shortened cervix.  Has cervical pessary in place.  TVUS - cevical length 1.1 cm with U-shaped funneling/ dynamic changes.  Some intraamniotic debris is again noted. (no significant change over last week)  Recommendations: Continue cervical progesterone. Follow up cervical length / growth / anatomy next week. Patient has not yet established care (was only seen while hospitalized) - will arrange new OB visit with [facility].  Alpha Gula, MD

## 2013-05-05 DIAGNOSIS — N883 Incompetence of cervix uteri: Secondary | ICD-10-CM

## 2013-05-05 HISTORY — DX: Incompetence of cervix uteri: N88.3

## 2013-05-08 ENCOUNTER — Encounter (HOSPITAL_COMMUNITY): Payer: Self-pay

## 2013-05-08 ENCOUNTER — Ambulatory Visit (HOSPITAL_COMMUNITY)
Admission: RE | Admit: 2013-05-08 | Discharge: 2013-05-08 | Disposition: A | Discharge status: Home / Self Care | Payer: Medicaid Other | Source: Ambulatory Visit | Attending: Obstetrics & Gynecology | Admitting: Obstetrics & Gynecology

## 2013-05-08 VITALS — BP 115/53 | HR 90 | Wt 193.0 lb

## 2013-05-08 DIAGNOSIS — Z452 Encounter for adjustment and management of vascular access device: Secondary | ICD-10-CM | POA: Insufficient documentation

## 2013-05-08 DIAGNOSIS — O3430 Maternal care for cervical incompetence, unspecified trimester: Secondary | ICD-10-CM

## 2013-05-08 DIAGNOSIS — O09529 Supervision of elderly multigravida, unspecified trimester: Secondary | ICD-10-CM | POA: Insufficient documentation

## 2013-05-08 DIAGNOSIS — O26879 Cervical shortening, unspecified trimester: Secondary | ICD-10-CM | POA: Insufficient documentation

## 2013-05-08 NOTE — Progress Notes (Signed)
Leslie Braun  was seen today for an ultrasound appointment.  See full report in [facility].  Impression: Single IUP at 22 4//7 weeks Shortened cervix -cervical pessary in place.  Currently getting vaginal progesterone, advanced maternal age Normal fetal anatomic survey No markers associated with aneuploidy noted Mild polyhydramnios - max verticle pocket of 8.3 cm noted.    TVUS - cervical length 1.3 cm.  Funneling noted- essentially unchanged since admission.  Recommendations: Patient has new OB visit at [facility] next week - has not had aneuploidy screening (too late for quad screen) Will offer genetic counseling and cell free fetal DNA at next visit. Recommend follow up for cervical length and assessment of amniotic fluid in 1-2 weeks.  Alpha Gula, MD

## 2013-05-09 ENCOUNTER — Other Ambulatory Visit (HOSPITAL_COMMUNITY): Payer: Self-pay

## 2013-05-16 ENCOUNTER — Other Ambulatory Visit (HOSPITAL_COMMUNITY): Payer: Self-pay | Admitting: Maternal and Fetal Medicine

## 2013-05-16 ENCOUNTER — Ambulatory Visit (HOSPITAL_COMMUNITY)
Admission: RE | Admit: 2013-05-16 | Discharge: 2013-05-16 | Disposition: A | Discharge status: Home / Self Care | Payer: Medicaid Other | Source: Ambulatory Visit | Attending: Obstetrics & Gynecology | Admitting: Obstetrics & Gynecology

## 2013-05-16 ENCOUNTER — Ambulatory Visit (HOSPITAL_COMMUNITY)
Admission: RE | Admit: 2013-05-16 | Discharge: 2013-05-16 | Disposition: A | Discharge status: Home / Self Care | Payer: Medicaid Other | Source: Ambulatory Visit

## 2013-05-16 DIAGNOSIS — O09529 Supervision of elderly multigravida, unspecified trimester: Secondary | ICD-10-CM | POA: Insufficient documentation

## 2013-05-16 DIAGNOSIS — O409XX0 Polyhydramnios, unspecified trimester, not applicable or unspecified: Secondary | ICD-10-CM | POA: Insufficient documentation

## 2013-05-16 DIAGNOSIS — O3430 Maternal care for cervical incompetence, unspecified trimester: Secondary | ICD-10-CM

## 2013-05-16 DIAGNOSIS — O26879 Cervical shortening, unspecified trimester: Secondary | ICD-10-CM | POA: Insufficient documentation

## 2013-05-16 MED ORDER — BETAMETHASONE SOD PHOS & ACET 6 (3-3) MG/ML IJ SUSP
12.0000 mg | Freq: Once | INTRAMUSCULAR | Status: AC
  Administered 2013-05-16: 12 mg via INTRAMUSCULAR
  Filled 2013-05-16: qty 2

## 2013-05-17 ENCOUNTER — Inpatient Hospital Stay (HOSPITAL_COMMUNITY)
Admission: AD | Admit: 2013-05-17 | Discharge: 2013-05-17 | Disposition: A | Discharge status: Home / Self Care | Payer: Medicaid Other | Source: Ambulatory Visit | Attending: Obstetrics & Gynecology | Admitting: Obstetrics & Gynecology

## 2013-05-17 DIAGNOSIS — O4700 False labor before 37 completed weeks of gestation, unspecified trimester: Secondary | ICD-10-CM | POA: Insufficient documentation

## 2013-05-17 MED ORDER — BETAMETHASONE SOD PHOS & ACET 6 (3-3) MG/ML IJ SUSP
12.0000 mg | Freq: Once | INTRAMUSCULAR | Status: AC
  Administered 2013-05-17: 12 mg via INTRAMUSCULAR
  Filled 2013-05-17: qty 2

## 2013-05-17 NOTE — Progress Notes (Signed)
Ms. Christo was seen for ultrasound appointment today.  Please see [facility] report for details.

## 2013-05-17 NOTE — MAU Note (Signed)
Pt presents for 2nd betamethasone injection

## 2013-05-19 ENCOUNTER — Encounter: Payer: Self-pay | Admitting: Women's Health

## 2013-05-19 NOTE — Progress Notes (Signed)
Genetic Counseling  High-Risk Gestation Note  Appointment Date:  05/16/2013 Referred By: Adam Phenix, MD Date of Birth:  1976-04-03 Partner:  Tempie Hoist    Pregnancy History: [id number] Estimated Date of Delivery: 09/06/13 Estimated Gestational Age: [redacted]w[redacted]d Attending: Rema Fendt, MD   Ms. Ruffin Pyo and her husband, Mr. Tempie Hoist, were seen for genetic counseling because of a maternal age of 37 y.o..  Spanish/English medical interpretation provided by [facility], Nettie Elm.   They were counseled regarding maternal age and the association with risk for chromosome conditions due to nondisjunction with aging of the ova.   We reviewed chromosomes, nondisjunction, and the associated 1 in 47 risk for fetal aneuploidy related to a maternal age of 37 y.o. at [redacted]w[redacted]d gestation. They were counseled that the risk for aneuploidy decreases as gestational age increases, accounting for those pregnancies which spontaneously abort.  We specifically discussed Down syndrome (trisomy 85), trisomies 34 and 77, and sex chromosome aneuploidies (47,XXX and 47,XXY) including the common features and prognoses of each.   We reviewed available screening options including noninvasive prenatal screening (NIPS)/cell free fetal DNA (cffDNA) testing and detailed ultrasound.  They were counseled that screening tests are used to modify a patient's a priori risk for aneuploidy, typically based on age. This estimate provides a pregnancy specific risk assessment. We reviewed the benefits and limitations of each option. Specifically, we discussed the conditions for which each test screens, the detection rates, and false positive rates of each. They were also counseled regarding diagnostic testing via amniocentesis. We reviewed the approximate 1 in 300-500 risk for complications for amniocentesis, including spontaneous pregnancy loss or preterm labor and delivery at this gestational age. Ms. Zeigler has had previous  ultrasounds through our office, and is being followed for shortened cervix and polyhydramnios. Polyhydramnios can have multiple etiologies, including sporadic, underlying congenital anomalies and genetic conditions. The prognosis depends upon the underlying cause. After careful consideration, Ms. Robins declined NIPS and amniocentesis.   Follow-up ultrasound was performed at the time of today's visit. Complete ultrasound results reported separately.   Regarding the family histories, the patient reported a brother who was born with apparently isolated cleft lip and palate, surgically corrected. No additional relatives were reported with cleft lip or palate, including this brother's children. We discussed that cleft lip +/- cleft palate can be syndromic or isolated.  If the patient's relative has a syndromic form of clefting, the chance of having an affected child depends on the inheritance pattern of that condition.  If the patient's relative has an isolated form of clefting, we discussed the probable multifactorial inheritance and explained that genetic testing for isolated cleft lip +/- cleft palate is not currently available.  Based on the family history, this couple's chance to have a baby with an isolated cleft lip +/- cleft palate is approximately 0.6 %. Targeted ultrasound is available to assess for orofacial clefting. However, we discussed that prenatal ultrasound cannot diagnose or rule out all birth defects or genetic conditions. Detailed family history was not obtained due to time constraints of today's appointment. Without further information regarding the provided family history, an accurate genetic risk cannot be calculated. Further genetic counseling is warranted if more information is obtained.  Ms. Lexa Coronado denied exposure to environmental toxins or chemical agents. She denied the use of alcohol, tobacco or street drugs. She denied significant viral illnesses during the course of her  pregnancy. Her medical and surgical histories were noncontributory.   I counseled this couple  regarding the above risks and available options.  The approximate face-to-face time with the genetic counselor was 35 minutes.  Quinn Plowman, MS,  Certified Genetic Counselor 05/19/2013

## 2013-05-20 ENCOUNTER — Other Ambulatory Visit: Payer: Self-pay | Admitting: Obstetrics & Gynecology

## 2013-05-20 DIAGNOSIS — O09529 Supervision of elderly multigravida, unspecified trimester: Secondary | ICD-10-CM

## 2013-05-20 DIAGNOSIS — O3430 Maternal care for cervical incompetence, unspecified trimester: Secondary | ICD-10-CM

## 2013-05-21 ENCOUNTER — Observation Stay (HOSPITAL_COMMUNITY)
Admission: AD | Admit: 2013-05-21 | Discharge: 2013-05-22 | Disposition: A | Discharge status: Home / Self Care | Payer: Medicaid Other | Source: Ambulatory Visit | Attending: Obstetrics and Gynecology | Admitting: Obstetrics and Gynecology

## 2013-05-21 ENCOUNTER — Encounter (HOSPITAL_COMMUNITY): Payer: Self-pay | Admitting: *Deleted

## 2013-05-21 ENCOUNTER — Encounter: Payer: Self-pay | Admitting: Obstetrics & Gynecology

## 2013-05-21 ENCOUNTER — Ambulatory Visit (HOSPITAL_COMMUNITY)
Admission: RE | Admit: 2013-05-21 | Discharge: 2013-05-21 | Disposition: A | Discharge status: Home / Self Care | Payer: Medicaid Other | Source: Ambulatory Visit | Attending: Obstetrics & Gynecology | Admitting: Obstetrics & Gynecology

## 2013-05-21 DIAGNOSIS — O409XX0 Polyhydramnios, unspecified trimester, not applicable or unspecified: Secondary | ICD-10-CM | POA: Insufficient documentation

## 2013-05-21 DIAGNOSIS — O26879 Cervical shortening, unspecified trimester: Secondary | ICD-10-CM | POA: Insufficient documentation

## 2013-05-21 DIAGNOSIS — O09529 Supervision of elderly multigravida, unspecified trimester: Secondary | ICD-10-CM | POA: Insufficient documentation

## 2013-05-21 DIAGNOSIS — O3430 Maternal care for cervical incompetence, unspecified trimester: Secondary | ICD-10-CM

## 2013-05-21 DIAGNOSIS — O409XX1 Polyhydramnios, unspecified trimester, fetus 1: Secondary | ICD-10-CM

## 2013-05-21 HISTORY — DX: Incompetence of cervix uteri: N88.3

## 2013-05-21 LAB — COMPREHENSIVE METABOLIC PANEL WITH GFR
ALT: 11 U/L (ref 0–35)
AST: 9 U/L (ref 0–37)
Albumin: 3.1 g/dL — ABNORMAL LOW (ref 3.5–5.2)
Alkaline Phosphatase: 53 U/L (ref 39–117)
BUN: 9 mg/dL (ref 6–23)
CO2: 24 meq/L (ref 19–32)
Calcium: 9.3 mg/dL (ref 8.4–10.5)
Chloride: 103 meq/L (ref 96–112)
Creatinine, Ser: 0.38 mg/dL — ABNORMAL LOW (ref 0.50–1.10)
GFR calc Af Amer: >90 mL/min
GFR calc non Af Amer: >90 mL/min
Glucose, Bld: 78 mg/dL (ref 70–99)
Potassium: 4.2 meq/L (ref 3.5–5.1)
Sodium: 137 meq/L (ref 135–145)
Total Bilirubin: 0.1 mg/dL — ABNORMAL LOW (ref 0.3–1.2)
Total Protein: 6.2 g/dL (ref 6.0–8.3)

## 2013-05-21 LAB — CBC
HCT: 31.9 % — ABNORMAL LOW (ref 36.0–46.0)
Hemoglobin: 10.8 g/dL — ABNORMAL LOW (ref 12.0–15.0)
MCHC: 33.9 g/dL (ref 30.0–36.0)
RBC: 3.86 MIL/uL — ABNORMAL LOW (ref 3.87–5.11)
WBC: 10.8 10*3/uL — ABNORMAL HIGH (ref 4.0–10.5)

## 2013-05-21 MED ORDER — CALCIUM CARBONATE ANTACID 500 MG PO CHEW: 2.0000 | CHEWABLE_TABLET | ORAL | Status: DC | PRN

## 2013-05-21 MED ORDER — PROGESTERONE MICRONIZED 200 MG PO CAPS
200.0000 mg | ORAL_CAPSULE | Freq: Every day | ORAL | Status: DC
  Administered 2013-05-21: 200 mg via VAGINAL
  Filled 2013-05-21 (×2): qty 1

## 2013-05-21 MED ORDER — MAGNESIUM SULFATE 40 G IN LACTATED RINGERS - SIMPLE
2.0000 g/h | INTRAVENOUS | Status: AC
  Filled 2013-05-21: qty 500

## 2013-05-21 MED ORDER — ZOLPIDEM TARTRATE 5 MG PO TABS: 5.0000 mg | ORAL_TABLET | Freq: Every evening | ORAL | Status: DC | PRN

## 2013-05-21 MED ORDER — SODIUM CHLORIDE 0.9 % IV SOLN
INTRAVENOUS | Status: DC
  Administered 2013-05-21 – 2013-05-22 (×2): via INTRAVENOUS

## 2013-05-21 MED ORDER — ACETAMINOPHEN 325 MG PO TABS
650.0000 mg | ORAL_TABLET | ORAL | Status: DC | PRN
  Administered 2013-05-22: 650 mg via ORAL
  Filled 2013-05-21: qty 2

## 2013-05-21 MED ORDER — PRENATAL MULTIVITAMIN CH
1.0000 | ORAL_TABLET | Freq: Every day | ORAL | Status: DC
  Filled 2013-05-21 (×2): qty 1

## 2013-05-21 MED ORDER — DOCUSATE SODIUM 100 MG PO CAPS
100.0000 mg | ORAL_CAPSULE | Freq: Every day | ORAL | Status: DC
  Administered 2013-05-22: 100 mg via ORAL
  Filled 2013-05-21: qty 1

## 2013-05-21 MED ORDER — MAGNESIUM SULFATE BOLUS VIA INFUSION
4.0000 g | Freq: Once | INTRAVENOUS | Status: AC
  Administered 2013-05-21: 4 g via INTRAVENOUS
  Filled 2013-05-21: qty 500

## 2013-05-21 NOTE — Progress Notes (Signed)
Leslie Braun  was seen today for an ultrasound appointment.  See full report in [facility].  Impression: Single IUP at 24 4/7 weeks Shortened cervix - currently with cervical pessary and vaginal progesterone.  Completed course of betamethasone last week. Mild polyhydramnios - max verticle pocket 8.2 cm  TVUS - 3mm cervical length (essentially no measureable cervix).  Cervical debris is again noted.  This is a significant change over the last week.  Recommendations: Findings discussed with Dr. Macon Large. Recommend admission for observation - would give a 12 hour course of Magnesium sulfate for neuroprotection, NICU consult. Pessary is currently in place - would remove if the patient develops significant clinical change (bleeding, frequent uterine contractions, ruptured membranes).  Alpha Gula, MD

## 2013-05-21 NOTE — H&P (Signed)
Leslie Braun is a 37 y.o. female presenting at 24 wk 4 days for Admission to Antenatal to receive Magnesium Sulfate x 12 hours for neuroprophylaxis  As advised by Dr Claudean Severance of MFM this morning at evaluation where cervix found to be shortened ever further, now 0.3 cm cervical length. This is the first pregnancy showing preterm cervical changes. Patient received BMZ last week.  Patient denies current contractions, bleeding, or loss of fluid History OB History   Grav Para Term Preterm Abortions TAB SAB Ect Mult Living   3 2 2       2      Past Medical History  Diagnosis Date  . Seasonal allergies   . 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 Cholecystectomy     Family History: family history is not on file. Social History:  reports that she has never smoked. She does not have any smokeless tobacco history on file. She reports that  drinks alcohol. She reports that she does not use illicit drugs.   Prenatal Transfer Tool  Maternal Diabetes: No Genetic Screening: Normal patient had genetic counselling for AMA, but did not receive amniocentesis or advanced screening beyond u/s Maternal Ultrasounds/Referrals: Abnormal:  Findings:   Other: shortened cervix, requiring Vaginal progesterone, vaginal Pessary, and this admission Fetal Ultrasounds or other Referrals:  None Maternal Substance Abuse:  No Significant Maternal Medications:  None Significant Maternal Lab Results:  None Other Comments:  None  ROS   denies contractions , bleeding or srom Blood pressure 120/61, pulse 80, temperature 98.3 F (36.8 C), temperature source Oral, resp. rate 18, height 5\' 1"  (1.549 m), weight 86.183 kg (190 lb), last menstrual period 12/17/2012. Exam Physical Exam  Constitutional: She appears well-developed and well-nourished.  HENT:  Head: Normocephalic.  Eyes: Pupils are equal, round, and reactive to light.  Cardiovascular: Normal rate.   Respiratory:  Effort normal.  GI: Soft.  Genitourinary:  Trans vaginal u/s today in MFM showed shortened cervix, 3 mm length, closed, with debris above cervix in amniotic fluid     Prenatal labs: ABO, Rh: --/--/A POS (08/20 0020) Antibody:   Rubella: 5.86 (08/20 0020) RPR: NON REACTIVE (08/20 0020)  HBsAg: NEGATIVE (08/20 0020)  HIV: NON REACTIVE (08/20 0020)  GBS:     Assessment/Plan: Pregnancy 24 wk 4 days Short cervix 3mm length S/p BMZ last week  Plan:  Observe x 24 hours, give BMZ for fetal neuroprophylaxis, and consider outpt therapy if pt can limit activity Importance of pt limiting activity emphasized to family, who states that family can eliminate care responsibilities if pt goes home. This is important , given that pt was unable to be admitted from MFM this afternoon because she had to go home to make arrangement for children.   Mishael Krysiak V 05/21/2013, 10:05 PM

## 2013-05-22 ENCOUNTER — Encounter (HOSPITAL_COMMUNITY): Payer: Self-pay | Admitting: *Deleted

## 2013-05-22 LAB — TYPE AND SCREEN
ABO/RH(D): A POS
Antibody Screen: NEGATIVE

## 2013-05-22 NOTE — Discharge Summary (Signed)
Antenatal Physician Discharge Summary  Patient ID: Leslie Braun MRN: [id number] DOB/AGE: Dec 19, 1975 37 y.o.  Admit date: 05/21/2013 Discharge date: 05/22/2013  Admission Diagnoses: shortened cervix  Discharge Diagnoses: the same   Prenatal Procedures: ultrasound and mag for neuroprotection  Intrapartum Procedures: Maternal Fetal Medicine   Significant Diagnostic Studies:  Results for orders placed during the hospital encounter of 05/21/13 (from the past 168 hour(s))  COMPREHENSIVE METABOLIC PANEL   Collection Time    05/21/13 10:50 PM      Result Value Range   Sodium 137  135 - 145 mEq/L   Potassium 4.2  3.5 - 5.1 mEq/L   Chloride 103  96 - 112 mEq/L   CO2 24  19 - 32 mEq/L   Glucose, Bld 78  70 - 99 mg/dL   BUN 9  6 - 23 mg/dL   Creatinine, Ser 4.09 (*) 0.50 - 1.10 mg/dL   Calcium 9.3  8.4 - 81.1 mg/dL   Total Protein 6.2  6.0 - 8.3 g/dL   Albumin 3.1 (*) 3.5 - 5.2 g/dL   AST 9  0 - 37 U/L   ALT 11  0 - 35 U/L   Alkaline Phosphatase 53  39 - 117 U/L   Total Bilirubin 0.1 (*) 0.3 - 1.2 mg/dL   GFR calc non Af Amer >90  >90 mL/min   GFR calc Af Amer >90  >90 mL/min  CBC   Collection Time    05/21/13 10:50 PM      Result Value Range   WBC 10.8 (*) 4.0 - 10.5 K/uL   RBC 3.86 (*) 3.87 - 5.11 MIL/uL   Hemoglobin 10.8 (*) 12.0 - 15.0 g/dL   HCT 91.4 (*) 78.2 - 95.6 %   MCV 82.6  78.0 - 100.0 fL   MCH 28.0  26.0 - 34.0 pg   MCHC 33.9  30.0 - 36.0 g/dL   RDW 21.3  08.6 - 57.8 %   Platelets 267  150 - 400 K/uL  TYPE AND SCREEN   Collection Time    05/21/13 10:50 PM      Result Value Range   ABO/RH(D) A POS     Antibody Screen NEG     Sample Expiration 05/24/2013      Treatments: magnesium x 12 hours  Hospital Course:  This is a 37 y.o. [id number] with IUP at [redacted]w[redacted]d admitted for known shortened cervix with continued shortening down to now 3mm and needing Magnesium for neuroprotections.  The pt was admitted on 9/17 and started on CP magnesium.  She completed 12 hours of  therapy.  Her betamethasone had already been given on 9/12 and 9/13 and was thus not repeated.  She has a pessary in place and is on PV progesterone and no changes were made to this management. The following day, it was felt she was stable for discharge. She has had very little PNC and thus an appointment was made for her at [facility] on Monday to start her care there. She has f/u for [place] and cervical length with MFM next week on Wednesday.    Discharge Exam: BP 89/63  Pulse 76  Temp(Src) 98 F (36.7 C) (Oral)  Resp 20  Ht 5\' 1"  (1.549 m)  Wt 86.183 kg (190 lb)  BMI 35.92 kg/m2  LMP 12/17/2012 General appearance: alert, cooperative and no distress Resp: clear to auscultation bilaterally Cardio: regular rate and rhythm, S1, S2 normal, no murmur, click, rub or gallop GI: soft, non-tender; bowel sounds  normal; no masses,  no organomegaly and gravid Extremities: extremities normal, atraumatic, no cyanosis or edema  Discharge Condition: good  Disposition: 01-Home or Self Care  Discharge Orders   Future Appointments Provider Department Dept Phone   05/26/2013 2:30 PM Adline Potter, NP [facility] [phone]   05/28/2013 4:00 PM [facility] [place] [facility] [phone]   06/04/2013 4:00 PM [facility] [place] [facility] [phone]   Future Orders Complete By Expires   Discharge patient  As directed    Comments:     To home       Medication List         DSS 100 MG Caps  Take 100 mg by mouth daily.     nitrofurantoin (macrocrystal-monohydrate) 100 MG capsule  Commonly known as:  MACROBID  Take 1 capsule (100 mg total) by mouth every 12 (twelve) hours.     prenatal multivitamin Tabs tablet  Take 1 tablet by mouth daily at 12 noon.     progesterone 200 MG capsule  Commonly known as:  PROMETRIUM  Place 1 capsule (200 mg total) vaginally at bedtime.           Follow-up Information   Follow up with  [facility]. (as scheduled on 06/02/13)    Specialty:  Obstetrics and Gynecology   Contact information:   [address] [place] [address] [phone]      Signed: Vale Haven M.D. 05/22/2013, 10:10 PM

## 2013-05-22 NOTE — Progress Notes (Signed)
UR completed 

## 2013-05-22 NOTE — Progress Notes (Signed)
FACULTY PRACTICE ANTEPARTUM(COMPREHENSIVE) NOTE  Leslie Braun is a 37 y.o. G3P2002 at [redacted]w[redacted]d by early ultrasound who is admitted for shortened cervix, 0.3 cm, with vaginal pessary in place, and pt having received BMZ x 2 last week.  She has been admitted for magnesium sulfate neuroprophylaxis tx as per recommendation dr Claudean Severance..   Fetal presentation is cephalic. She will complete 12 hrs mag at 11 am, and has a scheduled [facility] appt [facility] next Monday. Length of Stay:  1  Days  Subjective: Pt had a quiet night, no rom, bleeding or fluid loss Patient reports the fetal movement as active. Patient reports uterine contraction  activity as none. Patient reports  vaginal bleeding as none. Patient describes fluid per vagina as None.  Vitals:  Blood pressure 92/36, pulse 72, temperature 98.2 F (36.8 C), temperature source Oral, resp. rate 18, height 5\' 1"  (1.549 m), weight 86.183 kg (190 lb), last menstrual period 12/17/2012. Physical Examination:  General appearance - alert, well appearing, and in no distress Heart - normal rate and regular rhythm Abdomen - soft, nontender, nondistended Fundal Height:  size equals dates Cervical Exam: Not evaluated. and found to be / / and fetal presentation is cephalicby ultrasound yesterday Extremities: extremities normal, atraumatic, no cyanosis or edema and Homans sign is negative, no sign of DVT with DTRs 2+ bilaterally Membranes:intact  Fetal Monitoring:  normal 15 min strip on admit, no uterine contractions on continuous ext Toco thru the night  Labs:  Results for orders placed during the hospital encounter of 05/21/13 (from the past 24 hour(s))  COMPREHENSIVE METABOLIC PANEL   Collection Time    05/21/13 10:50 PM      Result Value Range   Sodium 137  135 - 145 mEq/L   Potassium 4.2  3.5 - 5.1 mEq/L   Chloride 103  96 - 112 mEq/L   CO2 24  19 - 32 mEq/L   Glucose, Bld 78  70 - 99 mg/dL   BUN 9  6 - 23 mg/dL   Creatinine, Ser  1.61 (*) 0.50 - 1.10 mg/dL   Calcium 9.3  8.4 - 09.6 mg/dL   Total Protein 6.2  6.0 - 8.3 g/dL   Albumin 3.1 (*) 3.5 - 5.2 g/dL   AST 9  0 - 37 U/L   ALT 11  0 - 35 U/L   Alkaline Phosphatase 53  39 - 117 U/L   Total Bilirubin 0.1 (*) 0.3 - 1.2 mg/dL   GFR calc non Af Amer >90  >90 mL/min   GFR calc Af Amer >90  >90 mL/min  CBC   Collection Time    05/21/13 10:50 PM      Result Value Range   WBC 10.8 (*) 4.0 - 10.5 K/uL   RBC 3.86 (*) 3.87 - 5.11 MIL/uL   Hemoglobin 10.8 (*) 12.0 - 15.0 g/dL   HCT 04.5 (*) 40.9 - 81.1 %   MCV 82.6  78.0 - 100.0 fL   MCH 28.0  26.0 - 34.0 pg   MCHC 33.9  30.0 - 36.0 g/dL   RDW 91.4  78.2 - 95.6 %   Platelets 267  150 - 400 K/uL  TYPE AND SCREEN   Collection Time    05/21/13 10:50 PM      Result Value Range   ABO/RH(D) A POS     Antibody Screen NEG     Sample Expiration 05/24/2013      Imaging Studies:  Currently EPIC will not allow sonographic studies to automatically populate into notes.  In the meantime, copy and paste results into note or free text.  Medications:  Scheduled . docusate sodium  100 mg Oral Daily  . prenatal multivitamin  1 tablet Oral Q1200  . progesterone  200 mg Vaginal QHS   I have reviewed the patient's current medications.  ASSESSMENT: Patient Active Problem List   Diagnosis Date Noted  . Vaginal bleeding in pregnancy 04/23/2013  . Cervical shortening complicating pregnancy 04/23/2013  . UTI in pregnancy, antepartum 04/23/2013    PLAN: Complete Mag x 12 hours at 11 am Out patient tx, with followup at [facility] next week ON Monday.   Michalene Debruler V 05/22/2013,6:46 AM

## 2013-05-23 ENCOUNTER — Other Ambulatory Visit: Payer: Self-pay | Admitting: Adult Health

## 2013-05-23 DIAGNOSIS — O402XX1 Polyhydramnios, second trimester, fetus 1: Secondary | ICD-10-CM

## 2013-05-23 DIAGNOSIS — O26872 Cervical shortening, second trimester: Secondary | ICD-10-CM

## 2013-05-23 DIAGNOSIS — O09522 Supervision of elderly multigravida, second trimester: Secondary | ICD-10-CM

## 2013-05-26 ENCOUNTER — Encounter: Payer: Self-pay | Admitting: Adult Health

## 2013-05-28 ENCOUNTER — Ambulatory Visit (HOSPITAL_COMMUNITY)
Admission: RE | Admit: 2013-05-28 | Discharge: 2013-05-28 | Disposition: A | Discharge status: Home / Self Care | Payer: Medicaid Other | Source: Ambulatory Visit | Attending: Obstetrics & Gynecology | Admitting: Obstetrics & Gynecology

## 2013-05-28 DIAGNOSIS — O09522 Supervision of elderly multigravida, second trimester: Secondary | ICD-10-CM

## 2013-05-28 DIAGNOSIS — O402XX1 Polyhydramnios, second trimester, fetus 1: Secondary | ICD-10-CM

## 2013-05-28 DIAGNOSIS — O26872 Cervical shortening, second trimester: Secondary | ICD-10-CM

## 2013-05-28 DIAGNOSIS — O09529 Supervision of elderly multigravida, unspecified trimester: Secondary | ICD-10-CM | POA: Insufficient documentation

## 2013-05-28 DIAGNOSIS — O409XX0 Polyhydramnios, unspecified trimester, not applicable or unspecified: Secondary | ICD-10-CM | POA: Insufficient documentation

## 2013-05-28 DIAGNOSIS — O26879 Cervical shortening, unspecified trimester: Secondary | ICD-10-CM | POA: Insufficient documentation

## 2013-06-03 ENCOUNTER — Other Ambulatory Visit: Payer: Self-pay | Admitting: Obstetrics & Gynecology

## 2013-06-03 DIAGNOSIS — O09529 Supervision of elderly multigravida, unspecified trimester: Secondary | ICD-10-CM

## 2013-06-03 DIAGNOSIS — O26879 Cervical shortening, unspecified trimester: Secondary | ICD-10-CM

## 2013-06-04 ENCOUNTER — Ambulatory Visit (HOSPITAL_COMMUNITY)
Admission: RE | Admit: 2013-06-04 | Discharge: 2013-06-04 | Disposition: A | Discharge status: Home / Self Care | Payer: Medicaid Other | Source: Ambulatory Visit | Attending: Obstetrics & Gynecology | Admitting: Obstetrics & Gynecology

## 2013-06-04 DIAGNOSIS — O409XX0 Polyhydramnios, unspecified trimester, not applicable or unspecified: Secondary | ICD-10-CM | POA: Insufficient documentation

## 2013-06-04 DIAGNOSIS — O26879 Cervical shortening, unspecified trimester: Secondary | ICD-10-CM | POA: Insufficient documentation

## 2013-06-04 DIAGNOSIS — O09529 Supervision of elderly multigravida, unspecified trimester: Secondary | ICD-10-CM | POA: Insufficient documentation

## 2013-06-04 NOTE — Progress Notes (Signed)
Leslie Braun  was seen today for an ultrasound appointment.  See full report in [facility].  Impression: Single IUP at 26 4/7 weeks Shortened cervix - currently with cervical pessary.  Completed course of betamethasone. Polyhydramnios - max verticle pocket 10.9 cm.   Stomach bubble visualized.  TVUS  ~ 3mm cervical length (essentially no measureable cervix).  U-shaped funneling.  Pessary appears in place.  Recommendations: Preterm labor, PROM, bleeding precautions given. Continue vaginal progesterone. Follow up next week for cervical length and interval growth.  Alpha Gula, MD

## 2013-06-10 ENCOUNTER — Other Ambulatory Visit: Payer: Self-pay | Admitting: Obstetrics & Gynecology

## 2013-06-10 DIAGNOSIS — O3430 Maternal care for cervical incompetence, unspecified trimester: Secondary | ICD-10-CM

## 2013-06-10 DIAGNOSIS — O09529 Supervision of elderly multigravida, unspecified trimester: Secondary | ICD-10-CM

## 2013-06-11 ENCOUNTER — Ambulatory Visit (HOSPITAL_COMMUNITY): Payer: MEDICAID

## 2013-06-16 ENCOUNTER — Inpatient Hospital Stay (HOSPITAL_COMMUNITY)
Admission: AD | Admit: 2013-06-16 | Discharge: 2013-06-16 | Disposition: A | Discharge status: Home / Self Care | Payer: Medicaid Other | Source: Ambulatory Visit | Attending: Obstetrics and Gynecology | Admitting: Obstetrics and Gynecology

## 2013-06-16 ENCOUNTER — Inpatient Hospital Stay (HOSPITAL_COMMUNITY): Payer: Medicaid Other

## 2013-06-16 ENCOUNTER — Encounter (HOSPITAL_COMMUNITY): Payer: Self-pay | Admitting: *Deleted

## 2013-06-16 DIAGNOSIS — O2390 Unspecified genitourinary tract infection in pregnancy, unspecified trimester: Secondary | ICD-10-CM | POA: Insufficient documentation

## 2013-06-16 DIAGNOSIS — N390 Urinary tract infection, site not specified: Secondary | ICD-10-CM | POA: Insufficient documentation

## 2013-06-16 DIAGNOSIS — O2343 Unspecified infection of urinary tract in pregnancy, third trimester: Secondary | ICD-10-CM

## 2013-06-16 DIAGNOSIS — O26879 Cervical shortening, unspecified trimester: Secondary | ICD-10-CM | POA: Insufficient documentation

## 2013-06-16 DIAGNOSIS — O368190 Decreased fetal movements, unspecified trimester, not applicable or unspecified: Secondary | ICD-10-CM | POA: Insufficient documentation

## 2013-06-16 LAB — URINALYSIS, ROUTINE W REFLEX MICROSCOPIC
Ketones, ur: 15 mg/dL — AB
Nitrite: POSITIVE — AB
Protein, ur: NEGATIVE mg/dL
Urobilinogen, UA: 0.2 mg/dL (ref 0.0–1.0)
pH: 6 (ref 5.0–8.0)

## 2013-06-16 LAB — URINE MICROSCOPIC-ADD ON

## 2013-06-16 MED ORDER — NIFEDIPINE ER OSMOTIC RELEASE 30 MG PO TB24: 30.0000 mg | ORAL_TABLET | Freq: Two times a day (BID) | ORAL | Status: DC

## 2013-06-16 MED ORDER — CEPHALEXIN 500 MG PO CAPS: 500.0000 mg | ORAL_CAPSULE | Freq: Four times a day (QID) | ORAL | Status: DC

## 2013-06-16 MED ORDER — PROGESTERONE MICRONIZED 200 MG PO CAPS: 200.0000 mg | ORAL_CAPSULE | Freq: Every day | ORAL | Status: DC

## 2013-06-16 MED ORDER — CEPHALEXIN 500 MG PO CAPS
500.0000 mg | ORAL_CAPSULE | ORAL | Status: AC
  Administered 2013-06-16: 500 mg via ORAL
  Filled 2013-06-16: qty 1

## 2013-06-16 MED ORDER — NIFEDIPINE ER OSMOTIC RELEASE 30 MG PO TB24
30.0000 mg | ORAL_TABLET | ORAL | Status: AC
  Administered 2013-06-16: 30 mg via ORAL
  Filled 2013-06-16: qty 1

## 2013-06-16 NOTE — MAU Note (Signed)
Pt C/O extreme pressure during the night @ 0200 like she had to have a BM, has continued to have pelvic pressure today, also decreased FM.

## 2013-06-16 NOTE — MAU Note (Signed)
Denies bleeding or LOF, does have urinary frequency, denies dysuria.

## 2013-06-16 NOTE — MAU Provider Note (Signed)
  History     CSN: [id number]  Arrival date and time: 06/16/13 1739   None     Chief Complaint  Patient presents with  . Decreased Fetal Movement  . pelvic pressure    HPI Leslie Braun is a 37 y.o. G3P2002 at [redacted]w[redacted]d by R=20 presents with complaints of decreased but present fetal movement, 1 contraction last night, and lower abdominal pressure. Pt states she is not having contractions, no LOF, no VB. Pt otherwise has no complaints.  Pts OB hx is significant for extremely short cervix (3mm by last image) on progestertone and with a pessary in place. Pt has rec'd BMZ 9/12 and 9/13 and has been seen in MFM but missed her last [place].  OB History   Grav Para Term Preterm Abortions TAB SAB Ect Mult Living   3 2 2       2       Past Medical History  Diagnosis Date  . Seasonal allergies   . 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 Gestational diabetes mellitus, diet-controlled     with second pregnancy    Past Surgical History  Procedure Laterality Date  . Cholecystectomy      History reviewed. No pertinent family history.  History  Substance Use Topics  . Smoking status: Never Smoker   . Smokeless tobacco: Not on file  . Alcohol Use: No    Allergies: No Known Allergies  Prescriptions prior to admission  Medication Sig Dispense Refill  . docusate sodium 100 MG CAPS Take 100 mg by mouth daily.  10 capsule  0  . loratadine (CLARITIN) 10 MG tablet Take 10 mg by mouth daily as needed for allergies.      . Prenatal Vit-Fe Fumarate-FA (PRENATAL MULTIVITAMIN) TABS tablet Take 1 tablet by mouth daily at 12 noon.  30 tablet  5  . progesterone (PROMETRIUM) 200 MG capsule Place 1 capsule (200 mg total) vaginally at bedtime.  30 capsule  2    ROS see above   Physical Exam   Blood pressure 123/63, pulse 86, temperature 98.3 F (36.8 C), temperature source Oral, resp. rate 20, last menstrual period 12/17/2012.  Physical Exam  Results for [name], [name] (MRN [id number]) as of 06/16/2013  19:55  Ref. Range 06/16/2013 18:00  Color, Urine Latest Range: YELLOW  YELLOW  APPearance Latest Range: CLEAR  CLEAR  Specific Gravity, Urine Latest Range: 1.005-1.030  >1.030 (H)  pH Latest Range: 5.0-8.0  6.0  Glucose Latest Range: NEGATIVE mg/dL NEGATIVE  Bilirubin Urine Latest Range: NEGATIVE  NEGATIVE  Ketones, ur Latest Range: NEGATIVE mg/dL 15 (A)  Protein Latest Range: NEGATIVE mg/dL NEGATIVE  Urobilinogen, UA Latest Range: 0.0-1.0 mg/dL 0.2  Nitrite Latest Range: NEGATIVE  POSITIVE (A)  Leukocytes, UA Latest Range: NEGATIVE  SMALL (A)  Hgb urine dipstick Latest Range: NEGATIVE  NEGATIVE  WBC, UA Latest Range: <3 WBC/hpf 21-50  Squamous Epithelial / LPF Latest Range: RARE  FEW (A)  Bacteria, UA Latest Range: RARE  FEW (A)    MAU Course  Procedures  MDM Prior to exam pt sent to [place] for BPP and cervical length.  Assessment and Plan  Pt transferred to Leslie Braun for remainder of care  Leslie Braun 06/16/2013, 7:51 PM

## 2013-06-16 NOTE — MAU Provider Note (Signed)
History     CSN: [id number]  Arrival date and time: 06/16/13 1739   None     Chief Complaint  Patient presents with  . Decreased Fetal Movement  . pelvic pressure    HPI Leslie Braun is a 37 y.o. G3P2002 at [redacted]w[redacted]d by R=20 presents with complaints of decreased but present fetal movement, 1 contraction last night, and lower abdominal pressure. Pt states she is not having contractions, no LOF, no VB. Pt otherwise has no complaints.  Pts OB hx is significant for extremely short cervix (3mm by last image) on progestertone and with a pessary in place. Pt has rec'd BMZ 9/12 and 9/13 and has been seen in MFM but missed her last [place].  OB History   Grav Para Term Preterm Abortions TAB SAB Ect Mult Living   3 2 2       2       Past Medical History  Diagnosis Date  . Seasonal allergies   . 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 Gestational diabetes mellitus, diet-controlled     with second pregnancy    Past Surgical History  Procedure Laterality Date  . Cholecystectomy      History reviewed. No pertinent family history.  History  Substance Use Topics  . Smoking status: Never Smoker   . Smokeless tobacco: Not on file  . Alcohol Use: No    Allergies: No Known Allergies  Prescriptions prior to admission  Medication Sig Dispense Refill  . docusate sodium 100 MG CAPS Take 100 mg by mouth daily.  10 capsule  0  . loratadine (CLARITIN) 10 MG tablet Take 10 mg by mouth daily as needed for allergies.      . Prenatal Vit-Fe Fumarate-FA (PRENATAL MULTIVITAMIN) TABS tablet Take 1 tablet by mouth daily at 12 noon.  30 tablet  5  . progesterone (PROMETRIUM) 200 MG capsule Place 1 capsule (200 mg total) vaginally at bedtime.  30 capsule  2    ROS see above   Physical Exam   Blood pressure 123/63, pulse 86, temperature 98.3 F (36.8 C), temperature source Oral, resp. rate 20, last menstrual period 12/17/2012.  Physical Exam  Results for Leslie Braun (MRN [id number]) as of 06/16/2013  19:55  Ref. Range 06/16/2013 18:00  Color, Urine Latest Range: YELLOW  YELLOW  APPearance Latest Range: CLEAR  CLEAR  Specific Gravity, Urine Latest Range: 1.005-1.030  >1.030 (H)  pH Latest Range: 5.0-8.0  6.0  Glucose Latest Range: NEGATIVE mg/dL NEGATIVE  Bilirubin Urine Latest Range: NEGATIVE  NEGATIVE  Ketones, ur Latest Range: NEGATIVE mg/dL 15 (A)  Protein Latest Range: NEGATIVE mg/dL NEGATIVE  Urobilinogen, UA Latest Range: 0.0-1.0 mg/dL 0.2  Nitrite Latest Range: NEGATIVE  POSITIVE (A)  Leukocytes, UA Latest Range: NEGATIVE  SMALL (A)  Hgb urine dipstick Latest Range: NEGATIVE  NEGATIVE  WBC, UA Latest Range: <3 WBC/hpf 21-50  Squamous Epithelial / LPF Latest Range: RARE  FEW (A)  Bacteria, UA Latest Range: RARE  FEW (A)    MAU Course  Procedures  MDM Prior to exam pt sent to [place] for BPP and cervical length.  Assessment and Plan  Report from Dr Ike Bene     A: 1. Cervical shortening complicating pregnancy, unspecified trimester   2. UTI in pregnancy, antepartum, third trimester     P: Consult Dr Jolayne Panther Keflex 500 mg QID x7 days Procardia 30 mg XL BID Continue vaginal progesterone F/U as scheduled in clinic on Wednesday Return to MAU as needed  [name], [name] 06/16/2013,  9:25 PM

## 2013-06-17 NOTE — MAU Provider Note (Signed)
Attestation of Attending Supervision of Advanced Practitioner (CNM/NP): Evaluation and management procedures were performed by the Advanced Practitioner under my supervision and collaboration.  I have reviewed the Advanced Practitioner's note and chart, and I agree with the management and plan.  Lailoni Baquera 06/17/2013 6:53 AM

## 2013-06-18 ENCOUNTER — Ambulatory Visit (HOSPITAL_COMMUNITY)
Admission: RE | Admit: 2013-06-18 | Discharge: 2013-06-18 | Disposition: A | Discharge status: Home / Self Care | Payer: Medicaid Other | Source: Ambulatory Visit | Attending: Obstetrics & Gynecology | Admitting: Obstetrics & Gynecology

## 2013-06-18 VITALS — BP 119/61 | HR 83 | Wt 190.0 lb

## 2013-06-18 DIAGNOSIS — O26879 Cervical shortening, unspecified trimester: Secondary | ICD-10-CM | POA: Insufficient documentation

## 2013-06-18 DIAGNOSIS — O26873 Cervical shortening, third trimester: Secondary | ICD-10-CM

## 2013-06-18 DIAGNOSIS — O09529 Supervision of elderly multigravida, unspecified trimester: Secondary | ICD-10-CM | POA: Insufficient documentation

## 2013-06-18 DIAGNOSIS — O409XX0 Polyhydramnios, unspecified trimester, not applicable or unspecified: Secondary | ICD-10-CM | POA: Insufficient documentation

## 2013-06-18 DIAGNOSIS — O3430 Maternal care for cervical incompetence, unspecified trimester: Secondary | ICD-10-CM

## 2013-06-18 LAB — URINE CULTURE: Colony Count: >=100000

## 2013-06-23 NOTE — MAU Provider Note (Signed)
Attestation of Attending Supervision of Advanced Practitioner (CNM/NP): Evaluation and management procedures were performed by the Advanced Practitioner under my supervision and collaboration.  I have reviewed the Advanced Practitioner's note and chart, and I agree with the management and plan.  Jaretssi Kraker 06/23/2013 2:16 PM

## 2013-06-25 ENCOUNTER — Ambulatory Visit (HOSPITAL_COMMUNITY)
Admission: RE | Admit: 2013-06-25 | Discharge: 2013-06-25 | Disposition: A | Discharge status: Home / Self Care | Payer: Medicaid Other | Source: Ambulatory Visit | Attending: Obstetrics & Gynecology | Admitting: Obstetrics & Gynecology

## 2013-06-25 ENCOUNTER — Inpatient Hospital Stay (HOSPITAL_COMMUNITY)
Admission: AD | Admit: 2013-06-25 | Discharge: 2013-06-27 | DRG: 781 | Disposition: A | Discharge status: Home / Self Care | Payer: Medicaid Other | Source: Ambulatory Visit | Attending: Obstetrics and Gynecology | Admitting: Obstetrics and Gynecology

## 2013-06-25 ENCOUNTER — Encounter (HOSPITAL_COMMUNITY): Payer: Self-pay | Admitting: *Deleted

## 2013-06-25 DIAGNOSIS — O2390 Unspecified genitourinary tract infection in pregnancy, unspecified trimester: Secondary | ICD-10-CM

## 2013-06-25 DIAGNOSIS — O09529 Supervision of elderly multigravida, unspecified trimester: Secondary | ICD-10-CM | POA: Insufficient documentation

## 2013-06-25 DIAGNOSIS — N390 Urinary tract infection, site not specified: Secondary | ICD-10-CM | POA: Diagnosis present

## 2013-06-25 DIAGNOSIS — O409XX0 Polyhydramnios, unspecified trimester, not applicable or unspecified: Secondary | ICD-10-CM | POA: Insufficient documentation

## 2013-06-25 DIAGNOSIS — O26879 Cervical shortening, unspecified trimester: Principal | ICD-10-CM | POA: Diagnosis present

## 2013-06-25 DIAGNOSIS — A498 Other bacterial infections of unspecified site: Secondary | ICD-10-CM | POA: Diagnosis present

## 2013-06-25 DIAGNOSIS — O26873 Cervical shortening, third trimester: Secondary | ICD-10-CM

## 2013-06-25 LAB — URINALYSIS, ROUTINE W REFLEX MICROSCOPIC
Glucose, UA: NEGATIVE mg/dL
Ketones, ur: NEGATIVE mg/dL
Nitrite: NEGATIVE
Protein, ur: NEGATIVE mg/dL
Specific Gravity, Urine: 1.015 (ref 1.005–1.030)
Urobilinogen, UA: 0.2 mg/dL (ref 0.0–1.0)

## 2013-06-25 LAB — CBC
HCT: 29.2 % — ABNORMAL LOW (ref 36.0–46.0)
Hemoglobin: 10.2 g/dL — ABNORMAL LOW (ref 12.0–15.0)
MCH: 28.5 pg (ref 26.0–34.0)
MCV: 81.6 fL (ref 78.0–100.0)
Platelets: 211 10*3/uL (ref 150–400)
RBC: 3.58 MIL/uL — ABNORMAL LOW (ref 3.87–5.11)
RDW: 13.1 % (ref 11.5–15.5)
WBC: 8.1 10*3/uL (ref 4.0–10.5)

## 2013-06-25 LAB — URINE MICROSCOPIC-ADD ON

## 2013-06-25 LAB — TYPE AND SCREEN: ABO/RH(D): A POS

## 2013-06-25 MED ORDER — PROGESTERONE MICRONIZED 200 MG PO CAPS
200.0000 mg | ORAL_CAPSULE | Freq: Every day | ORAL | Status: DC
  Administered 2013-06-25 – 2013-06-26 (×2): 200 mg via VAGINAL
  Filled 2013-06-25 (×3): qty 1

## 2013-06-25 MED ORDER — CALCIUM CARBONATE ANTACID 500 MG PO CHEW
2.0000 | CHEWABLE_TABLET | ORAL | Status: DC | PRN
  Administered 2013-06-26: 400 mg via ORAL
  Filled 2013-06-25: qty 1

## 2013-06-25 MED ORDER — PRENATAL MULTIVITAMIN CH
1.0000 | ORAL_TABLET | Freq: Every day | ORAL | Status: DC
  Administered 2013-06-26 – 2013-06-27 (×2): 1 via ORAL
  Filled 2013-06-25 (×2): qty 1

## 2013-06-25 MED ORDER — DOCUSATE SODIUM 100 MG PO CAPS
100.0000 mg | ORAL_CAPSULE | Freq: Every day | ORAL | Status: DC
  Administered 2013-06-26: 100 mg via ORAL
  Filled 2013-06-25 (×3): qty 1

## 2013-06-25 MED ORDER — SODIUM CHLORIDE 0.9 % IV SOLN: 250.0000 mL | INTRAVENOUS | Status: DC | PRN

## 2013-06-25 MED ORDER — INFLUENZA VAC SPLIT QUAD 0.5 ML IM SUSP
0.5000 mL | INTRAMUSCULAR | Status: AC
  Administered 2013-06-26: 0.5 mL via INTRAMUSCULAR
  Filled 2013-06-25: qty 0.5

## 2013-06-25 MED ORDER — NIFEDIPINE ER 30 MG PO TB24
30.0000 mg | ORAL_TABLET | Freq: Two times a day (BID) | ORAL | Status: DC
  Administered 2013-06-25 – 2013-06-27 (×4): 30 mg via ORAL
  Filled 2013-06-25 (×6): qty 1

## 2013-06-25 MED ORDER — SODIUM CHLORIDE 0.9 % IJ SOLN
3.0000 mL | Freq: Two times a day (BID) | INTRAMUSCULAR | Status: DC
  Administered 2013-06-25 – 2013-06-27 (×4): 3 mL via INTRAVENOUS

## 2013-06-25 MED ORDER — SODIUM CHLORIDE 0.9 % IJ SOLN
3.0000 mL | INTRAMUSCULAR | Status: DC | PRN
  Administered 2013-06-25: 3 mL via INTRAVENOUS

## 2013-06-25 NOTE — H&P (Signed)
FACULTY PRACTICE ANTEPARTUM(COMPREHENSIVE) NOTE  Leslie Braun is a 37 y.o. G3P2002 at [redacted]w[redacted]d  who is admitted for short cervix. Patient with history of shortening cervix during this pregnancy and currently has a cervical pessary in place. Patient was noted to have prolapsing membranes past the pessary today during routine weekly cervical check with MFM. Patient reports experiencing some frequency and urinary pressure over the past 2 days. She denies dysuria. Patient is currently without other complaints and denies cramping/contractions, VB or LOF. She reports good fetal movement   Vitals:  Last menstrual period 12/17/2012. There were no vitals filed for this visit. Physical Examination:  General appearance - alert, well appearing, and in no distress Chest - clear to auscultation, no wheezes, rales or rhonchi, symmetric air entry Heart - normal rate and regular rhythm Abdomen - soft, gravid, nontender Pelvic Exam:  Not performed, pessary still in place Extremities: extremities normal, atraumatic, no cyanosis or edema with DTRs 2+ bilaterally Membranes:intact   Labs:  No results found for this or any previous visit (from the past 24 hour(s)).  Imaging Studies:    10/22 ultrasound report pending  Medications:  Scheduled . docusate sodium  100 mg Oral Daily  . [START ON 06/26/2013] prenatal multivitamin  1 tablet Oral Q1200  . sodium chloride  3 mL Intravenous Q12H     ASSESSMENT: Patient Active Problem List   Diagnosis Date Noted  . Vaginal bleeding in pregnancy 04/23/2013  . Cervical shortening complicating pregnancy 04/23/2013  . UTI in pregnancy, antepartum 04/23/2013    PLAN: 37 yo G3P2002 at [redacted]w[redacted]d with shortening cervix and now prolapsing membranes with pessary in place - Admit for observation for now - Patient s/p BMZ on 9/12 and 9/13 - Strict bedrest - Will check UA and UCx - Continue procardia and daily vaginal prometrium - routine antepartum care - per MFM, remove  pessary if ROM or spontaneous onset of labor  Parissa Chiao 06/25/2013,4:30 PM

## 2013-06-26 LAB — CULTURE, OB URINE
Colony Count: NO GROWTH
Culture: NO GROWTH

## 2013-06-26 MED ORDER — BETAMETHASONE SOD PHOS & ACET 6 (3-3) MG/ML IJ SUSP
12.0000 mg | Freq: Once | INTRAMUSCULAR | Status: AC
  Administered 2013-06-26: 12 mg via INTRAMUSCULAR
  Filled 2013-06-26 (×2): qty 2

## 2013-06-26 NOTE — Progress Notes (Signed)
Patient ID: Leslie Braun, female   DOB: 1976-02-26, 37 y.o.   MRN: [id number] FACULTY PRACTICE ANTEPARTUM(COMPREHENSIVE) NOTE  Leslie Braun is a 37 y.o. [id number] with Estimated Date of Delivery: 09/06/13   By  early ultrasound [redacted]w[redacted]d  who is admitted for significant silent cervical change.  She has a cervical pessary in place per MFM  Fetal presentation is cephalic. Length of Stay:  1  Days  Date of admission:06/25/2013  Subjective: No complaints at all, no pain Patient reports the fetal movement as active. Patient reports uterine contraction  activity as none. Patient reports  vaginal bleeding as none. Patient describes fluid per vagina as None.  Vitals:  Blood pressure 107/52, pulse 81, temperature 98.3 F (36.8 C), temperature source Oral, resp. rate 18, height 5\' 3"  (1.6 m), weight 192 lb (87.091 kg), last menstrual period 12/17/2012. Filed Vitals:   06/25/13 1635 06/25/13 1707 06/25/13 2005 06/26/13 0002  BP: 122/65  89/45 107/52  Pulse: 88  75 81  Temp: 98.1 F (36.7 C)  98.3 F (36.8 C) 98.3 F (36.8 C)  TempSrc: Oral  Oral Oral  Resp: 18  18 18   Height: 5\' 3"  (1.6 m) 5\' 3"  (1.6 m)    Weight: 192 lb (87.091 kg) 192 lb (87.091 kg)     Physical Examination:  General appearance - alert, well appearing, and in no distress Abdomen - soft, nontender,  Fundal Height:  size equals dates  Extremities: extremities normal, atraumatic, no cyanosis or edema with DTRs 2+ bilaterally Membranes:intact  Fetal Monitoring:  reassuring     Labs:  Results for orders placed during the hospital encounter of 06/25/13 (from the past 24 hour(s))  URINALYSIS, ROUTINE W REFLEX MICROSCOPIC   Collection Time    06/25/13  5:50 PM      Result Value Range   Color, Urine YELLOW  YELLOW   APPearance HAZY (*) CLEAR   Specific Gravity, Urine 1.015  1.005 - 1.030   pH 6.0  5.0 - 8.0   Glucose, UA NEGATIVE  NEGATIVE mg/dL   Hgb urine dipstick NEGATIVE  NEGATIVE   Bilirubin Urine NEGATIVE  NEGATIVE   Ketones, ur NEGATIVE  NEGATIVE mg/dL   Protein, ur NEGATIVE  NEGATIVE mg/dL   Urobilinogen, UA 0.2  0.0 - 1.0 mg/dL   Nitrite NEGATIVE  NEGATIVE   Leukocytes, UA MODERATE (*) NEGATIVE  URINE MICROSCOPIC-ADD ON   Collection Time    06/25/13  5:50 PM      Result Value Range   Squamous Epithelial / LPF FEW (*) RARE   WBC, UA 21-50  <3 WBC/hpf   RBC / HPF 7-10  <3 RBC/hpf   Bacteria, UA MANY (*) RARE   Urine-Other MUCOUS PRESENT    CBC   Collection Time    06/25/13  8:40 PM      Result Value Range   WBC 8.1  4.0 - 10.5 K/uL   RBC 3.58 (*) 3.87 - 5.11 MIL/uL   Hemoglobin 10.2 (*) 12.0 - 15.0 g/dL   HCT 69.6 (*) 29.5 - 28.4 %   MCV 81.6  78.0 - 100.0 fL   MCH 28.5  26.0 - 34.0 pg   MCHC 34.9  30.0 - 36.0 g/dL   RDW 13.2  44.0 - 10.2 %   Platelets 211  150 - 400 K/uL  TYPE AND SCREEN   Collection Time    06/25/13  8:40 PM      Result Value Range   ABO/RH(D) A POS  Antibody Screen NEG     Sample Expiration 06/28/2013      Imaging Studies:      Medications:  Scheduled . betamethasone acetate-betamethasone sodium phosphate  12 mg Intramuscular Once  . docusate sodium  100 mg Oral Daily  . influenza vac split quadrivalent PF  0.5 mL Intramuscular Tomorrow-1000  . NIFEdipine  30 mg Oral BID  . prenatal multivitamin  1 tablet Oral Q1200  . progesterone  200 mg Vaginal QHS  . sodium chloride  3 mL Intravenous Q12H   I have reviewed the patient's current medications.  ASSESSMENT: [redacted]w[redacted]d  Silent significant cervical shortening  PLAN: Continue procardia and prometrium Magnesium if labor ensues(has had neuroprotective dosing already) Since her betamethasone was so early will repeat 1 dose today  EURE,LUTHER H 06/26/2013,7:32 AM

## 2013-06-26 NOTE — Progress Notes (Signed)
UR completed 

## 2013-06-27 DIAGNOSIS — O4700 False labor before 37 completed weeks of gestation, unspecified trimester: Secondary | ICD-10-CM

## 2013-06-27 NOTE — Progress Notes (Signed)
Pt. Ready to be discharged; Interpreter paged and discharge instructions given with interpretation. All questions are answered. Pt. Confused about upcoming appointments, and those were thoroughly explained. All belongings are with the patient and pt.s husband at bedside for interpretation. Pt. Wheeled to front and husband picked her up.

## 2013-06-27 NOTE — Discharge Summary (Signed)
Physician Discharge Summary  Patient ID: Leslie Braun MRN: [id number] DOB/AGE: [birth year] 37 y.o.  Admit date: 06/25/2013 Discharge date: 06/27/2013  Admission Diagnoses:29 weeks 4 days gestation, premature dilation and effacement E. Coli UTI Discharge Diagnoses: 29 weeks 6 days PCD and PCE Active Problems:   * No active hospital problems. *   Discharged Condition: stable  Hospital Course: Patient admitted this US findings of cervical dilation 1.7 cm and mild prolapse of membranes with pessary in place  Consults: MFM  Significant Diagnostic Studies: radiology: Ultrasound: transvaginal  Treatments: IV hydration, antibiotics: keflex and procardia, prometrium  Discharge Exam: Blood pressure 91/41, pulse 93, temperature 98.4 F (36.9 C), temperature source Oral, resp. rate 18, height 5\' 3"  (1.6 m), weight 192 lb (87.091 kg), last menstrual period 12/17/2012. General appearance: alert, cooperative and no distress GI: gravid Extremities: extremities normal, atraumatic, no cyanosis or edema  Disposition: 01-Home or Self Care   Future Appointments Provider Department Dept Phone   07/16/2013 4:00 PM [facility] [place] [facility] [phone]       Medication List         cephALEXin 500 MG capsule  Commonly known as:  KEFLEX  Take 1 capsule (500 mg total) by mouth 4 (four) times daily.     DSS 100 MG Caps  Take 100 mg by mouth daily.     loratadine 10 MG tablet  Commonly known as:  CLARITIN  Take 10 mg by mouth daily as needed for allergies.     NIFEdipine 30 MG 24 hr tablet  Commonly known as:  PROCARDIA-XL/ADALAT-CC/NIFEDICAL-XL  Take 1 tablet (30 mg total) by mouth 2 (two) times daily.     prenatal multivitamin Tabs tablet  Take 1 tablet by mouth daily at 12 noon.     progesterone 200 MG capsule  Commonly known as:  PROMETRIUM  Place 1 capsule (200 mg total) vaginally at bedtime.           Follow-up Information   Follow up  with [facility] In 5 days.   Contact information:   [address] [place] [address]     Preterm labor precautions, pelvic rest, decreased activity stressed  Signed: Orma Cheetham 06/27/2013, 3:24 PM

## 2013-07-01 ENCOUNTER — Encounter (HOSPITAL_COMMUNITY): Payer: Self-pay | Admitting: *Deleted

## 2013-07-01 ENCOUNTER — Inpatient Hospital Stay (HOSPITAL_COMMUNITY)
Admission: AD | Admit: 2013-07-01 | Discharge: 2013-07-03 | DRG: 775 | Disposition: A | Discharge status: Home / Self Care | Payer: Medicaid Other | Source: Ambulatory Visit | Attending: Obstetrics & Gynecology | Admitting: Obstetrics & Gynecology

## 2013-07-01 DIAGNOSIS — O409XX0 Polyhydramnios, unspecified trimester, not applicable or unspecified: Secondary | ICD-10-CM

## 2013-07-01 DIAGNOSIS — O4290 Premature rupture of membranes, unspecified as to length of time between rupture and onset of labor, unspecified weeks of gestation: Secondary | ICD-10-CM

## 2013-07-01 DIAGNOSIS — O3430 Maternal care for cervical incompetence, unspecified trimester: Secondary | ICD-10-CM | POA: Diagnosis present

## 2013-07-01 DIAGNOSIS — O26879 Cervical shortening, unspecified trimester: Secondary | ICD-10-CM

## 2013-07-01 LAB — CBC
Hemoglobin: 10.9 g/dL — ABNORMAL LOW (ref 12.0–15.0)
MCHC: 34.6 g/dL (ref 30.0–36.0)
MCV: 81 fL (ref 78.0–100.0)
Platelets: 250 10*3/uL (ref 150–400)
RBC: 3.89 MIL/uL (ref 3.87–5.11)
WBC: 10 10*3/uL (ref 4.0–10.5)

## 2013-07-01 MED ORDER — ZOLPIDEM TARTRATE 5 MG PO TABS: 5.0000 mg | ORAL_TABLET | Freq: Every evening | ORAL | Status: DC | PRN

## 2013-07-01 MED ORDER — FENTANYL CITRATE 0.05 MG/ML IJ SOLN: 50.0000 ug | INTRAMUSCULAR | Status: DC | PRN

## 2013-07-01 MED ORDER — BENZOCAINE-MENTHOL 20-0.5 % EX AERO
1.0000 "application " | INHALATION_SPRAY | CUTANEOUS | Status: DC | PRN
  Administered 2013-07-01: 1 via TOPICAL
  Filled 2013-07-01: qty 56

## 2013-07-01 MED ORDER — LACTATED RINGERS IV SOLN: 500.0000 mL | INTRAVENOUS | Status: DC | PRN

## 2013-07-01 MED ORDER — MAGNESIUM SULFATE BOLUS VIA INFUSION
6.0000 g | Freq: Once | INTRAVENOUS | Status: AC
  Administered 2013-07-01: 6 g via INTRAVENOUS

## 2013-07-01 MED ORDER — WITCH HAZEL-GLYCERIN EX PADS: 1.0000 "application " | MEDICATED_PAD | CUTANEOUS | Status: DC | PRN

## 2013-07-01 MED ORDER — SENNOSIDES-DOCUSATE SODIUM 8.6-50 MG PO TABS
2.0000 | ORAL_TABLET | ORAL | Status: DC
  Administered 2013-07-02 – 2013-07-03 (×2): 2 via ORAL
  Filled 2013-07-01 (×2): qty 2

## 2013-07-01 MED ORDER — ONDANSETRON HCL 4 MG/2ML IJ SOLN: 4.0000 mg | Freq: Four times a day (QID) | INTRAMUSCULAR | Status: DC | PRN

## 2013-07-01 MED ORDER — OXYTOCIN BOLUS FROM INFUSION
500.0000 mL | INTRAVENOUS | Status: DC
  Administered 2013-07-01: 500 mL via INTRAVENOUS

## 2013-07-01 MED ORDER — ACETAMINOPHEN 325 MG PO TABS: 650.0000 mg | ORAL_TABLET | ORAL | Status: DC | PRN

## 2013-07-01 MED ORDER — AMOXICILLIN 500 MG PO CAPS: 500.0000 mg | ORAL_CAPSULE | Freq: Three times a day (TID) | ORAL | Status: DC

## 2013-07-01 MED ORDER — ONDANSETRON HCL 4 MG/2ML IJ SOLN: 4.0000 mg | INTRAMUSCULAR | Status: DC | PRN

## 2013-07-01 MED ORDER — LIDOCAINE HCL (PF) 1 % IJ SOLN
30.0000 mL | INTRAMUSCULAR | Status: DC | PRN
  Filled 2013-07-01 (×2): qty 30

## 2013-07-01 MED ORDER — DIPHENHYDRAMINE HCL 25 MG PO CAPS: 25.0000 mg | ORAL_CAPSULE | Freq: Four times a day (QID) | ORAL | Status: DC | PRN

## 2013-07-01 MED ORDER — ERYTHROMYCIN BASE 250 MG PO TABS: 250.0000 mg | ORAL_TABLET | Freq: Four times a day (QID) | ORAL | Status: DC

## 2013-07-01 MED ORDER — LANOLIN HYDROUS EX OINT: TOPICAL_OINTMENT | CUTANEOUS | Status: DC | PRN

## 2013-07-01 MED ORDER — IBUPROFEN 600 MG PO TABS
600.0000 mg | ORAL_TABLET | Freq: Four times a day (QID) | ORAL | Status: DC | PRN
  Administered 2013-07-01: 600 mg via ORAL
  Filled 2013-07-01: qty 1

## 2013-07-01 MED ORDER — DOCUSATE SODIUM 100 MG PO CAPS: 100.0000 mg | ORAL_CAPSULE | Freq: Every day | ORAL | Status: DC

## 2013-07-01 MED ORDER — DIBUCAINE 1 % RE OINT: 1.0000 "application " | TOPICAL_OINTMENT | RECTAL | Status: DC | PRN

## 2013-07-01 MED ORDER — IBUPROFEN 600 MG PO TABS
600.0000 mg | ORAL_TABLET | Freq: Four times a day (QID) | ORAL | Status: DC
  Administered 2013-07-01 – 2013-07-03 (×9): 600 mg via ORAL
  Filled 2013-07-01 (×9): qty 1

## 2013-07-01 MED ORDER — LACTATED RINGERS IV SOLN
INTRAVENOUS | Status: DC
  Administered 2013-07-01: 02:00:00 via INTRAVENOUS

## 2013-07-01 MED ORDER — OXYCODONE-ACETAMINOPHEN 5-325 MG PO TABS: 1.0000 | ORAL_TABLET | ORAL | Status: DC | PRN

## 2013-07-01 MED ORDER — MAGNESIUM SULFATE 40 G IN LACTATED RINGERS - SIMPLE
2.0000 g/h | INTRAVENOUS | Status: DC
  Administered 2013-07-01: 2 g/h via INTRAVENOUS
  Filled 2013-07-01: qty 500

## 2013-07-01 MED ORDER — CALCIUM CARBONATE ANTACID 500 MG PO CHEW: 2.0000 | CHEWABLE_TABLET | ORAL | Status: DC | PRN

## 2013-07-01 MED ORDER — MAGNESIUM SULFATE BOLUS VIA INFUSION
4.0000 g | Freq: Once | INTRAVENOUS | Status: DC
  Filled 2013-07-01: qty 500

## 2013-07-01 MED ORDER — SODIUM CHLORIDE 0.9 % IV SOLN
2.0000 g | Freq: Four times a day (QID) | INTRAVENOUS | Status: DC
  Administered 2013-07-01: 2 g via INTRAVENOUS
  Filled 2013-07-01 (×3): qty 2000

## 2013-07-01 MED ORDER — OXYTOCIN 40 UNITS IN LACTATED RINGERS INFUSION - SIMPLE MED
62.5000 mL/h | INTRAVENOUS | Status: DC
  Administered 2013-07-01: 62.5 mL/h via INTRAVENOUS
  Filled 2013-07-01: qty 1000

## 2013-07-01 MED ORDER — PRENATAL MULTIVITAMIN CH: 1.0000 | ORAL_TABLET | Freq: Every day | ORAL | Status: DC

## 2013-07-01 MED ORDER — LACTATED RINGERS IV SOLN
INTRAVENOUS | Status: DC
  Administered 2013-07-01: 03:00:00 via INTRAVENOUS

## 2013-07-01 MED ORDER — TETANUS-DIPHTH-ACELL PERTUSSIS 5-2.5-18.5 LF-MCG/0.5 IM SUSP
0.5000 mL | Freq: Once | INTRAMUSCULAR | Status: AC
Start: 2013-07-02 — End: 2013-07-02
  Administered 2013-07-02: 0.5 mL via INTRAMUSCULAR
  Filled 2013-07-01: qty 0.5

## 2013-07-01 MED ORDER — SODIUM CHLORIDE 0.9 % IV SOLN
250.0000 mg | Freq: Four times a day (QID) | INTRAVENOUS | Status: DC
  Filled 2013-07-01 (×3): qty 5

## 2013-07-01 MED ORDER — ONDANSETRON HCL 4 MG PO TABS: 4.0000 mg | ORAL_TABLET | ORAL | Status: DC | PRN

## 2013-07-01 MED ORDER — PRENATAL MULTIVITAMIN CH
1.0000 | ORAL_TABLET | Freq: Every day | ORAL | Status: DC
  Administered 2013-07-01 – 2013-07-03 (×3): 1 via ORAL
  Filled 2013-07-01 (×3): qty 1

## 2013-07-01 MED ORDER — SIMETHICONE 80 MG PO CHEW
80.0000 mg | CHEWABLE_TABLET | ORAL | Status: DC | PRN
  Administered 2013-07-01 – 2013-07-02 (×2): 80 mg via ORAL
  Filled 2013-07-01 (×2): qty 1

## 2013-07-01 MED ORDER — CITRIC ACID-SODIUM CITRATE 334-500 MG/5ML PO SOLN: 30.0000 mL | ORAL | Status: DC | PRN

## 2013-07-01 NOTE — MAU Note (Signed)
Large amounts of pale yellow fluid running down pt's legs. Denies any pain or bleeding

## 2013-07-01 NOTE — MAU Note (Signed)
PT SAYS AT 2300      SHE WAS SITTING   AND  LEAKING FLUID,  ,  THEN WHEN SHE GOT IN THE CAR  AT 0030- SHE FELT A GUSH-.  SHE HAS BEEN GOING TO MFM AND WAS SEEN HERE IN SEPT AND LAST WEEK.    HAS APPOINTMENT  IN CLINIC ON WED.

## 2013-07-01 NOTE — Lactation Note (Signed)
This note was copied from the chart of Leslie Liora Myles. Lactation Consultation Note Initial consultation;  Baby in NICU.  Mom states she has no questions or concerns with pumping her milk. Mom states she has not pumped since early this morning; enc mom to pump at least 8 times a day and at least once at night (written inst provided, in [place]). Mom setting up to pump now. Enc mom to review the NICU breastfeeding book, also in [place]. Enc mom to call for assistance if she has any concerns.  Patient Name: Leslie Braun [id number] Date: 07/01/2013 Reason for consult: Initial assessment;NICU baby   Maternal Data Formula Feeding for Exclusion: Yes Reason for exclusion: Admission to Intensive Care Unit (ICU) post-partum Has patient been taught Hand Expression?: Yes  Feeding    LATCH Score/Interventions                      Lactation Tools Discussed/Used Pump Review: Setup, frequency, and cleaning;Milk Storage   Consult Status Consult Status: PRN    Lenard Forth 07/01/2013, 2:43 PM

## 2013-07-01 NOTE — Consult Note (Signed)
Neonatology Note:  Attendance at Delivery:  I was asked by Dr. Odom (OB teaching service) to attend this NSVD at 30 3/[redacted] weeks GA following onset of PTL. The mother is a G3P2 A pos, GBS pending with incompetent cervix and PTL earlier in this pregnancy, on Procardia and Prometrium. She had received PNC mostly with MFM and had cervical shortening and pessary placement. She got 2 doses of Betamethasone on 9/12-13, and another dose on 10/23. She had a recent UTI treated with Keflex. There was mild polyhydramnios. ROM 4 hours prior to delivery, fluid clear. Mother received 1 dose of Ampicillin 1 hour before delivery and had just been started on magnesium sulfate. She was afebrile during the precipitous labor. NSVD was controlled; infant had fairly good tone and was breathing on her own. We did bulb suctioning, getting a moderate amount of clear secretions. She had fair air exchange and her O2 saturations were 97% in room air, but she had some subcostal retractions, so we placed her on the neopuff at 4 cm of pressure and room air, with good results (decreased work of breathing, O2 saturations remaining in mid-90s). Ap 8/9. Lungs clear to ausc in DR. Shown briefly to the parents, then transported to the NICU on neopuff CPAP and room air for further care. Her father accompanied us to the NICU.  Ota Ebersole C. Lorin Hauck, MD  

## 2013-07-01 NOTE — H&P (Signed)
Leslie Braun is a 37 y.o. female G3P2002 at 34w4 presenting for fluid leakage that began this evening around 2300. Per pt, sat up in bed, rolled over and had gush of fluid. When attempting to ambulate to the car fluid production increased and continued to drip down her legs. No vaginal bleeding, still feeling active fetal movement. No uterine contractions.   Pt obtains majority of [facility] with MFM given complication of cervical shortening s/p pessary placement. Of note she was most recently admitted to antenatal on 10/22 for shortened cervix, dilation by [place] to 1.7cm and noted prolapsing membranes past pessary on routine weekly cervical check at MFM. Pt was admitted for obs. Had already received BMZ on 9/12 and 9/13. She is also s/p mag tx on 9/17. She was continued on procardia and daily vaginal prometrium. Placed on strict bed rest. She did receive an additional dose of BMZ on 10/23. She was given keflex for UTI. Pt also notable for mild polyhydramnios. Was considered stable for d/c with preterm labor precautions and pelvic rest 10/24.  Maternal Medical History:  Reason for admission: Nausea.    OB History   Grav Para Term Preterm Abortions TAB SAB Ect Mult Living   3 2 2       2      Past Medical History  Diagnosis Date  . Seasonal allergies   . 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 Cholecystectomy     Family History: family history is not on file. Social History:  reports that she has never smoked. She does not have any smokeless tobacco history on file. She reports that she does not drink alcohol or use illicit drugs.   Prenatal Transfer Tool  Maternal Diabetes: No Genetic Screening: Declined Maternal Ultrasounds/Referrals: Abnormal:  Findings:   Other:mild poly and short cervical length Fetal Ultrasounds or other Referrals:  Referred to Materal Fetal Medicine  Maternal Substance Abuse:  No Significant Maternal Medications:  Meds include:  Other: keflex, claritin, procardia, prometrium Significant Maternal Lab Results:  None, GBS unknown Other Comments:  None  Review of Systems  Constitutional: Negative for fever.  HENT: Negative for congestion and sore throat.   Eyes: Negative for blurred vision and pain.  Respiratory: Negative for cough, shortness of breath and wheezing.   Cardiovascular: Negative for chest pain, palpitations and leg swelling.  Gastrointestinal: Positive for abdominal pain. Negative for heartburn, nausea, vomiting, diarrhea and constipation.  Genitourinary: Negative for dysuria, urgency and frequency.  Musculoskeletal: Negative for myalgias.  Skin: Negative for rash.  Neurological: Negative for dizziness, seizures, loss of consciousness, weakness and headaches.  Endo/Heme/Allergies: Does not bruise/bleed easily.  Psychiatric/Behavioral: Negative for depression.      Blood pressure 99/69, pulse 87, temperature 98.3 F (36.8 C), temperature source Oral, resp. rate 20, last menstrual period 12/17/2012. Exam Physical Exam  Constitutional: She is oriented to person, place, and time. She appears well-developed and well-nourished. No distress.  HENT:  Head: Normocephalic and atraumatic.  Eyes: EOM are normal.  Neck: Neck supple.  Cardiovascular: Normal rate, regular rhythm and normal heart sounds.   Respiratory: Effort normal and breath sounds normal. No respiratory distress. She has no wheezes.  GI: Soft. Bowel sounds are normal. She exhibits distension (gravid).  Genitourinary:  Vaginal exam: pessary in place (s/p removal)  Musculoskeletal: Normal range of motion. She exhibits no edema.  Neurological: She is alert and oriented to person, place, and time.  Skin: Skin is warm and dry. No erythema.  Psychiatric: She has a normal mood and affect. Her behavior is normal.    Prenatal labs: ABO, Rh: --/--/A POS (10/22 2040) Antibody: NEG (10/22 2040) Rubella: 5.86 (08/20 0020) RPR: NON REACTIVE (08/20  0020)  HBsAg: NEGATIVE (08/20 0020)  HIV: NON REACTIVE (08/20 0020)  GBS:   unknown  FHR- baseline 140, mod var, no accels, no decls Assessment/Plan: Ms Leslie Braun is a 37 y.o G3P2002 who presents at 37w3 with PPROM  1. PPROM- Pessary removed with sterile speculum exam this evening. S/p 3 doses of BMZ during course of this pregnancy in addition to mg 09/17. No regular contraction pattern at this time -expectant management if continue to have no signs of infection or fetal distress ([place] 10/23 frank breech presentation) -bed rest with bathroom privileges -Abx coverage with amp/erythro followed by PO for SROM -temp q4 -daily WBCs -starting mg for neuroprotection -will need NICU notified prior to delivery -GBS pending  2. FHR- reassuring -cat I tracing  3. Postpartum -unsure regarding feeding preference -will f/up at [facility] 07/01/2013, 1:24 AM  Pt was comfortable s/p ROM, cervix did not appear significantly changed from discharge. Pt was admitted to ante with above plan. After on ante pt began having painful ctx and quickly progressed. Review delivery summary for additional details. rec'd 1 dose amp and mg bolus. NICU was present for NSVD over intact perineum.  I spoke with and examined patient and agree with resident's note and plan of care.  Tawana Scale, MD OB Fellow 07/01/2013 3:44 AM

## 2013-07-01 NOTE — Progress Notes (Signed)
UR completed 

## 2013-07-02 MED ORDER — FAMOTIDINE 20 MG PO TABS
40.0000 mg | ORAL_TABLET | Freq: Every day | ORAL | Status: DC | PRN
  Administered 2013-07-02: 40 mg via ORAL
  Filled 2013-07-02: qty 2

## 2013-07-02 MED ORDER — GI COCKTAIL ~~LOC~~
30.0000 mL | Freq: Once | ORAL | Status: AC
  Administered 2013-07-02: 30 mL via ORAL
  Filled 2013-07-02: qty 30

## 2013-07-02 NOTE — Lactation Note (Signed)
This note was copied from the chart of Leslie Tyreonna Czaplicki. Lactation Consultation Note In house interpreter, Tobi Bastos, present for this consultation. Mom states she is starting to get small amounts of milk with the pump.  Reviewed pump frequency, cleaning, etc; reviewed NICU breastfeeding book. Reviewed and demonstrated hand expression. Inst mom to continue pumping and hand expressing at least 8 times per day, at least once per night (inst written on mom's white board). Questions answered.  Enc mom to call if she has any concerns.   Patient Name: Leslie Braun [id number] Date: 07/02/2013 Reason for consult: Follow-up assessment;NICU baby;Infant < 6lbs   Maternal Data Formula Feeding for Exclusion: Yes Reason for exclusion: Admission to Intensive Care Unit (ICU) post-partum Has patient been taught Hand Expression?: Yes Does the patient have breastfeeding experience prior to this delivery?: Yes  Feeding Feeding Type: Formula  LATCH Score/Interventions                      Lactation Tools Discussed/Used     Consult Status Consult Status: Follow-up Follow-up type: In-patient    Octavio Manns [facility] 07/02/2013, 10:51 AM

## 2013-07-02 NOTE — Progress Notes (Signed)
Post Partum Day 2 Subjective: Leslie Braun is a 37 y.o. [id number] who had a preterm SVD and has pending GBS status. She is doing well today with no complaints, tolerating PO, and has returned to regular bowel and bladder habits at this time. He child is in the NICU and she is successfully using the breast milk pump. The patient is not sure of what method of contraception she would like to utilize, but "does not want any more babies." She speaks some English, but would like an interpreter to discuss this; provider currently on 2nd attempt to connect with on-site interpreter to accomplish this. The patient denies sob, cp, calf tenderness, tachycardia. Patient denies dizziness and orthostatic symptoms at this time. Objective: Blood pressure 92/53, pulse 66, temperature 98.1 F (36.7 C), temperature source Oral, resp. rate 16, height 5\' 3"  (1.6 m), weight 87.544 kg (193 lb), last menstrual period 12/17/2012, SpO2 99.00%, unknown if currently breastfeeding.  Vitals rechecked by MNL, III PA-S @ 0745: 109/60; HR 70; SPO2 99% on RA  Physical Exam:  General: alert, cooperative, appears stated age and no distress Lochia: appropriate Uterine Fundus: firm DVT Evaluation: No evidence of DVT seen on physical exam. No cords or calf tenderness. No significant calf/ankle edema. No tachycardia Cardiac: Normal S1/S2 and rate, no murmur, rub, S3/S4; distal LE pulses regular with normal contour Pulmonary: No apparent respiratory distress; all lung fields clear to auscultation    Recent Labs  07/01/13 0155  HGB 10.9*  HCT 31.5*    Assessment/Plan: Plan for discharge tomorrow and Contraception (Continue to attempt contact with interpreter to discuss options).   LOS: 1 day   Jefm Petty 07/02/2013, 8:58 AM    I have seen and examined this patient and agree with above documentation in the PA student's note. Pt requested to wait until she is more awake to discuss contraception.   Rulon Abide, M.D. [facility]  Fellow 07/02/2013 10:59 AM

## 2013-07-03 LAB — CULTURE, BETA STREP (GROUP B ONLY)

## 2013-07-03 MED ORDER — IBUPROFEN 600 MG PO TABS: 600.0000 mg | ORAL_TABLET | Freq: Four times a day (QID) | ORAL | Status: DC

## 2013-07-03 NOTE — Progress Notes (Signed)
Teaching completed. Pt was discharged in wheelchair with friend. Pt left her ibuprofen prescription on unit. Pt was phoned and did not answer. Prescription taken to NICU and nurse Orpha Bur said she would give prescription to pt when she returned.

## 2013-07-03 NOTE — Discharge Summary (Signed)
Obstetric Discharge Summary Reason for Admission: rupture of membranes- PPROM @ 30.3wks Prenatal Procedures:  pessary placed in 2nd trimester due to cx shortening Intrapartum Procedures: spontaneous vaginal delivery and Antibiotics and Magnesium Postpartum Procedures: none Complications-Operative and Postpartum: none Hemoglobin  Date Value Range Status  07/01/2013 10.9* 12.0 - 15.0 g/dL Final     HCT  Date Value Range Status  07/01/2013 31.5* 36.0 - 46.0 % Final   Leslie Braun is a 37yo [id number] who was admitted with PPROM at 30.3 wks on 10/28 in the night. At the time of admission her pessary was removed with sterile procedure and due to lack of labor contractions, the pt was placed on the antenatal unit with latency abx started (had previously rec'd BMZ course). Shortly after her admission to ante, she began with regular, strong ctx and was found to be actively laboring and was tx to L&D while mag and Amp were given. She progressed to SVD precipitously with the NICU team present. Her PP course was uneventful and she was deemed to have received the full benefit of her hospital stay and will be discharged home. Infant is currently in the NICU.  Physical Exam:  General: alert, cooperative and no distress Heart: RRR Lungs: nl effort Lochia: appropriate Uterine Fundus: firm DVT Evaluation: No evidence of DVT seen on physical exam. Negative Homan's sign.  Discharge Diagnoses: Premature labor & delivery s/p PPROM at 30.3wks  Discharge Information: Date: 07/03/2013 Activity: unrestricted Diet: routine Medications: Ibuprofen Condition: stable Instructions: refer to practice specific booklet Discharge to: home Follow-up Information   Follow up with [facility]. Schedule an appointment as soon as possible for a visit in 6 weeks. (As needed)    Specialty:  Obstetrics and Gynecology   Contact information:   [address]  [place] [address] [phone]      Newborn  Data: Live born female  Birth Weight: 2 lb 11 oz (1220 g) APGAR: 8, 9  Home with NICU.  Leslie Braun 07/03/2013, 9:15 AM  I have seen and examined this patient and I agree with the above. Leslie Braun 1:37 AM 07/10/2013

## 2013-07-03 NOTE — Progress Notes (Signed)
Post Partum Day #2 Subjective: no complaints, up ad lib, voiding, tolerating PO and + flatus  Objective: Blood pressure 111/74, pulse 69, temperature 98.1 F (36.7 C), temperature source Oral, resp. rate 18, height 5\' 3"  (1.6 m), weight 87.544 kg (193 lb), last menstrual period 12/17/2012, SpO2 100.00%, unknown if currently breastfeeding.  Physical Exam:  General: alert, cooperative and no distress Lochia: appropriate Uterine Fundus: firm DVT Evaluation: Negative Homan's sign., No evidence of DVT on examination.   Recent Labs  07/01/13 0155  HGB 10.9*  HCT 31.5*    Assessment/Plan: Discharge home, Breastfeeding and Contraception Nexplanon   LOS: 2 days   Selena Lesser 07/03/2013, 8:54 AM

## 2013-07-04 NOTE — Progress Notes (Signed)
Clinical Social Work Department PSYCHOSOCIAL ASSESSMENT - MATERNAL/CHILD 07/03/2013  Patient:  Leslie Braun  Account Number:  [id number]  Admit Date:  07/01/2013  Marjo Bicker Name:   Leslie Braun    Clinical Social Worker:  Lulu Riding, LCSW   Date/Time:  07/03/2013 11:30 AM  Date Referred:  07/03/2013   Referral source  NICU     Referred reason  NICU   Other referral source:    I:  FAMILY / HOME ENVIRONMENT Child's legal guardian:  PARENT  Guardian - Name Guardian - Age Guardian - Address  Leslie Braun 37 [address] [place] [address]  [facility]  same   Other household support members/support persons Name Relationship DOB   DAUGHTER 16   DAUGHTER 8   Other support:   Leslie Braun states she has good supports.    II  PSYCHOSOCIAL DATA Information Source:  Patient Interview  Event organiser Employment:   FOB works at a Tax adviser in [employer] resources:   If Medicaid - County:    School / Grade:   Maternity Care Coordinator / Child Services Coordination / Early Interventions:   CC4C  Cultural issues impacting care:   Leslie Braun speaks Spanish only    III  STRENGTHS Strengths  Adequate Resources  Compliance with medical plan  Home prepared for Child (including basic supplies)  Other - See comment  Supportive family/friends  Understanding of illness   Strength comment:  Leslie Braun states pedatric follow up will be at [facility] in [place].   IV  RISK FACTORS AND CURRENT PROBLEMS Current Problem:  None   Risk Factor & Current Problem Patient Issue Family Issue Risk Factor / Current Problem Comment   N N     V  SOCIAL WORK ASSESSMENT CSW met with Leslie Braun in her third floor room to introduce myself and complete assessment for NICU admission with the assistance of a Spanish interpreter.  Leslie Braun was pleasant and receptive to CSW's visit.  She states she is feeling well and that baby is doing well.  She reports that this is  her first experience with having a premature baby, but receiving positive updates has made her feel much better.  She appears calm and to be coping well at this time.  She reports having good supports and no issues with transportation after her discharge in order to visit baby.  She states her husband or a friend will bring her to the hospital.  CSW offered gas cards since she is coming from [place].  She states she will let CSW know if this resource is needed and thanked CSW.  She states she has two daughters at home who will be helpful with the baby.  Her oldest daughter goes to [employer] and her middle daughter goes to [employer].  She denies any symptoms of PPD after their births.  CSW discussed signs and symptoms of PPD to watch for and asked Leslie Braun to contact CSW or her doctor if she has concerns at any time.  She agreed.  She reports no questions or needs at this time.        VI SOCIAL WORK PLAN Social Work Plan  Psychosocial Support/Ongoing Assessment of Needs   Type of pt/family education:   PPD signs and symptoms  Ongoing support offered by NICU CSW   If child protective services report - county:   If child protective services report - date:   Information/referral to community resources comment:  No referral needs noted at this time.   Other social work plan:

## 2013-07-05 NOTE — Progress Notes (Signed)
I have seen and examined this patient and I agree with the above. See Discharge Summary. Cam Hai 7:21 AM 07/05/2013

## 2013-07-08 NOTE — H&P (Signed)
Attestation of Attending Supervision of Fellow: Evaluation and management procedures were performed by the Fellow under my supervision and collaboration.  I have reviewed the Fellow's note and chart, and I agree with the management and plan.    

## 2013-07-16 ENCOUNTER — Ambulatory Visit (HOSPITAL_COMMUNITY): Payer: Self-pay

## 2013-07-23 ENCOUNTER — Ambulatory Visit: Payer: Self-pay | Admitting: Obstetrics and Gynecology

## 2013-08-21 ENCOUNTER — Emergency Department (HOSPITAL_COMMUNITY)
Admission: EM | Admit: 2013-08-21 | Discharge: 2013-08-22 | Disposition: A | Discharge status: Home / Self Care | Payer: Medicaid Other | Attending: Emergency Medicine | Admitting: Emergency Medicine

## 2013-08-21 ENCOUNTER — Encounter (HOSPITAL_COMMUNITY): Payer: Self-pay | Admitting: Emergency Medicine

## 2013-08-21 DIAGNOSIS — R599 Enlarged lymph nodes, unspecified: Secondary | ICD-10-CM | POA: Insufficient documentation

## 2013-08-21 DIAGNOSIS — L089 Local infection of the skin and subcutaneous tissue, unspecified: Secondary | ICD-10-CM | POA: Insufficient documentation

## 2013-08-21 DIAGNOSIS — Z8742 Personal history of other diseases of the female genital tract: Secondary | ICD-10-CM | POA: Insufficient documentation

## 2013-08-21 DIAGNOSIS — R21 Rash and other nonspecific skin eruption: Secondary | ICD-10-CM | POA: Insufficient documentation

## 2013-08-21 MED ORDER — HYDROCODONE-ACETAMINOPHEN 7.5-325 MG/15ML PO SOLN
10.0000 mL | Freq: Once | ORAL | Status: AC
  Administered 2013-08-21: 10 mL via ORAL
  Filled 2013-08-21: qty 15

## 2013-08-21 MED ORDER — PREDNISOLONE SODIUM PHOSPHATE 15 MG/5ML PO SOLN
30.0000 mg | Freq: Once | ORAL | Status: AC
  Administered 2013-08-21: 30 mg via ORAL
  Filled 2013-08-21: qty 2

## 2013-08-21 MED ORDER — MUPIROCIN CALCIUM 2 % EX CREA: TOPICAL_CREAM | CUTANEOUS | Status: DC

## 2013-08-21 MED ORDER — AMOXICILLIN 250 MG/5ML PO SUSR
250.0000 mg | Freq: Once | ORAL | Status: AC
  Administered 2013-08-21: 250 mg via ORAL
  Filled 2013-08-21: qty 5

## 2013-08-21 MED ORDER — AMOXICILLIN 400 MG/5ML PO SUSR: 400.0000 mg | Freq: Three times a day (TID) | ORAL | Status: DC

## 2013-08-21 MED ORDER — PREDNISOLONE SODIUM PHOSPHATE 15 MG/5ML PO SOLN: ORAL | Status: DC

## 2013-08-21 MED ORDER — ACETAMINOPHEN-CODEINE 120-12 MG/5ML PO SUSP: ORAL | Status: DC

## 2013-08-21 NOTE — ED Notes (Signed)
Swelling of upper lip intermittently since July.

## 2013-08-22 NOTE — ED Provider Notes (Signed)
Medical screening examination/treatment/procedure(s) were performed by non-physician practitioner and as supervising physician I was immediately available for consultation/collaboration.  EKG Interpretation   None         Aviyon Hocevar L Jameeka Marcy, MD 08/22/13 0752 

## 2013-08-22 NOTE — ED Provider Notes (Signed)
CSN: [id number]     Arrival date & time 08/21/13  2239 History   First MD Initiated Contact with Patient 08/21/13 2259     Chief Complaint  Patient presents with  . Oral Swelling   (Consider location/radiation/quality/duration/timing/severity/associated sxs/prior Treatment) HPI Comments: The patient is a 37 year old female who presents to the emergency department with complaint of swelling of her upper lip intermittently since July of 2014. The patient speaks very little English and the history is obtained through a relative who operates as the patient's interpreter.  History is given to the interpreter is that the patient develops bumps with blisters on the top of the lip, the lip then begins to swell and become quite painful. On this particular occasion the patient also developed some blisters on. But these have almost all dry then. The patient at this time as swelling of the lip and the opening of the nose and has pain when she attempts to open her mouth or to speak. She's not having any problem with breathing and not having any problem with swallowing. There no lesions on any other portions of the face or the body. There's been no reported fever.  The history is provided by the patient and a relative. The history is limited by a language barrier. A language interpreter was used.    Past Medical History  Diagnosis Date  . Seasonal allergies   . 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 Cholecystectomy     History reviewed. No pertinent family history. History  Substance Use Topics  . Smoking status: Never Smoker   . Smokeless tobacco: Not on file  . Alcohol Use: No   OB History   Grav Para Term Preterm Abortions TAB SAB Ect Mult Living   3 3 2 1      3      Review of Systems  Constitutional: Negative for activity change.       All ROS Neg except as noted in HPI  HENT: Negative for nosebleeds.   Eyes: Negative for photophobia and  discharge.  Respiratory: Negative for cough, shortness of breath and wheezing.   Cardiovascular: Negative for chest pain and palpitations.  Gastrointestinal: Negative for abdominal pain and blood in stool.  Genitourinary: Negative for dysuria, frequency and hematuria.  Musculoskeletal: Negative for arthralgias, back pain and neck pain.  Skin: Positive for rash.  Neurological: Negative for dizziness, seizures and speech difficulty.  Psychiatric/Behavioral: Negative for hallucinations and confusion.    Allergies  Review of patient's allergies indicates no known allergies.  Home Medications   Current Outpatient Rx  Name  Route  Sig  Dispense  Refill  . ASPIRIN PO   Oral   Take 1 tablet by mouth once as needed (for symptoms of pain).         Marland Kitchen loratadine (CLARITIN) 10 MG tablet   Oral   Take 10 mg by mouth daily as needed for allergies.         . Prenatal Vit-Fe Fumarate-FA (PRENATAL MULTIVITAMIN) TABS tablet   Oral   Take 1 tablet by mouth daily at 12 noon.   30 tablet   5   . acetaminophen-codeine (CAPITAL/CODEINE) 120-12 MG/5ML suspension      10 ml po q6h prn pain   150 mL   0     Please label in spanish   . amoxicillin (AMOXIL) 400 MG/5ML suspension   Oral   Take 5 mLs (400 mg total) by mouth  3 (three) times daily.   100 mL   0   . mupirocin cream (BACTROBAN) 2 %      Apply to nose, lip and forehead tid   15 g   0   . prednisoLONE (ORAPRED) 15 MG/5ML solution      30mg  po daily after a meal.   60 mL   0    BP 137/65  Pulse 83  Temp(Src) 98 F (36.7 C) (Oral)  Resp 20  Ht 5\' 4"  (1.626 m)  Wt 180 lb (81.647 kg)  BMI 30.88 kg/m2  SpO2 100%  LMP 08/21/2013  Breastfeeding? Yes Physical Exam  Nursing note and vitals reviewed. Constitutional: She is oriented to person, place, and time. She appears well-developed and well-nourished.  Non-toxic appearance.  HENT:  Head: Normocephalic.  Right Ear: Tympanic membrane and external ear normal.  Left  Ear: Tympanic membrane and external ear normal.  There is a scaling rash with some serous drainage at the opening in the floor of right and left nares. There is a similar rash on the upper lip. There are lesions under the buccal mucosa of the upper lip. Patient would not cooperate for me to examine the buccal mucosa of the lower lip. Both lips are mildly swollen. There is no swelling of the tongue. There is limited evaluation of the posterior pharynx, but the airway appears to be patent. The uvula in the midline. The patient speaks to her family member in [place], but appears to be speaking in complete sentences without complication.  Eyes: EOM and lids are normal. Pupils are equal, round, and reactive to light.  Neck: Normal range of motion. Neck supple. Carotid bruit is not present. No tracheal deviation present.  Cardiovascular: Normal rate, regular rhythm, normal heart sounds, intact distal pulses and normal pulses.   Pulmonary/Chest: Breath sounds normal. No respiratory distress.  Abdominal: Soft. Bowel sounds are normal. There is no tenderness. There is no guarding.  Musculoskeletal: Normal range of motion.  Lymphadenopathy:       Head (right side): No submandibular adenopathy present.       Head (left side): No submandibular adenopathy present.    She has cervical adenopathy.  Neurological: She is alert and oriented to person, place, and time. She has normal strength. No cranial nerve deficit or sensory deficit.  Skin: Skin is warm and dry.  See H. EENT exam.  Psychiatric: She has a normal mood and affect. Her speech is normal.    ED Course  Procedures (including critical care time) Labs Review Labs Reviewed - No data to display Imaging Review No results found.  EKG Interpretation   None       MDM   1. Facial infection    *I have reviewed nursing notes, vital signs, and all appropriate lab and imaging results for this patient.**  The swelling of the lips appears to be  related to a infection of the skin probably staphylococcal. Vital signs are well within normal limits. Pulse oximetry is 100% on room air. Within normal limits by my interpretation. There is no wheezing or stridor noted on examination. There is no swelling of any of the areas of the body noted on examination.  The plan at this time is for the patient to be treated with Tylenol codeine for pain, Amoxil, Bactroban cream, and Orapred. The patient is given instructions to return immediately if any changes in condition, difficulty with swallowing or breathing, spread of the swelling involving the face or any other  areas of the body. The patient acknowledges understanding through the interpreter.  Kathie Dike, PA-C 08/22/13 [phone]

## 2014-07-06 ENCOUNTER — Encounter (HOSPITAL_COMMUNITY): Payer: Self-pay | Admitting: Emergency Medicine

## 2014-07-16 IMAGING — US US OB TRANSVAGINAL
1 series · 13 of 25 positions shown · non-contrast
Comparison: none

[Series 1: us ob transvaginal · 0.23mm/px · 13 of 25 slices shown]
[im 1/25]
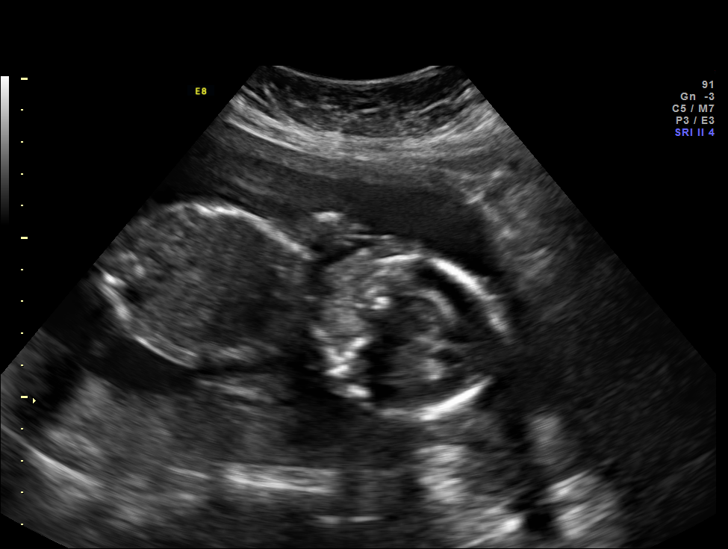
[im 3/25]
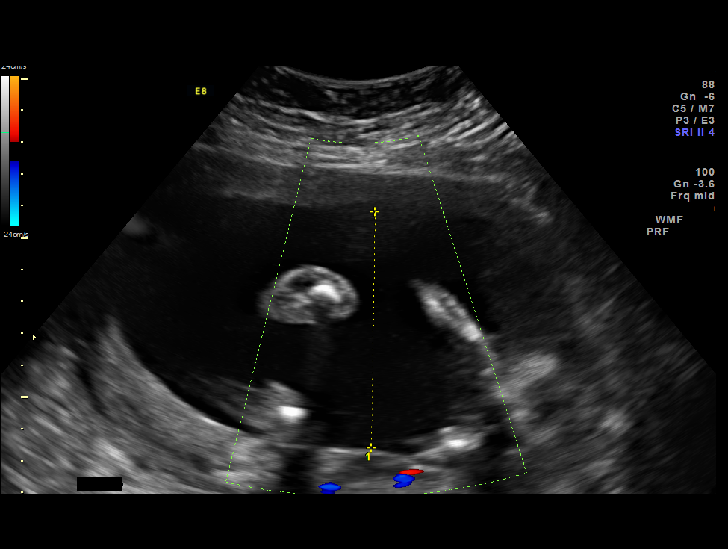
[im 5/25]
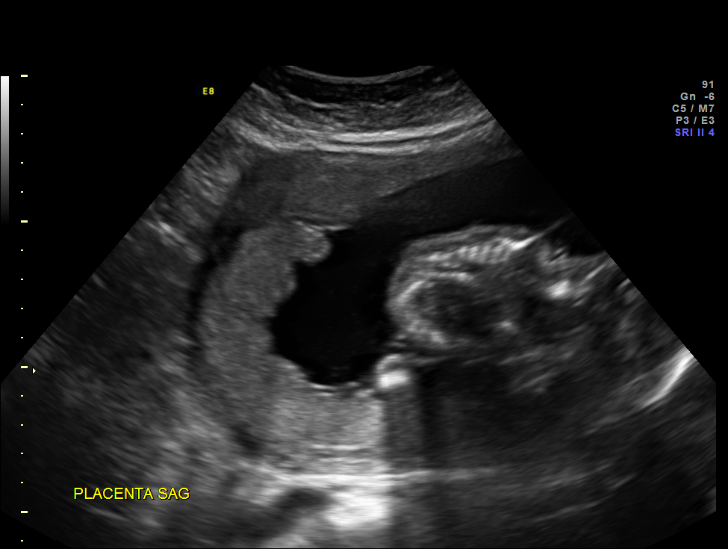
[im 7/25]
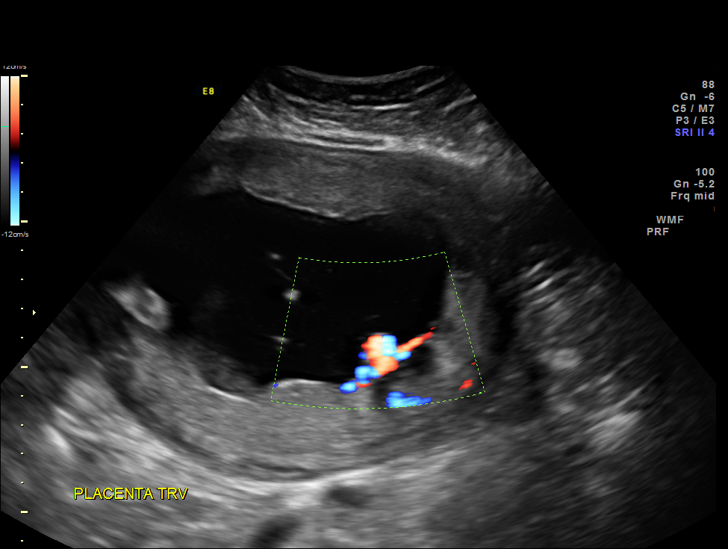
[im 9/25]
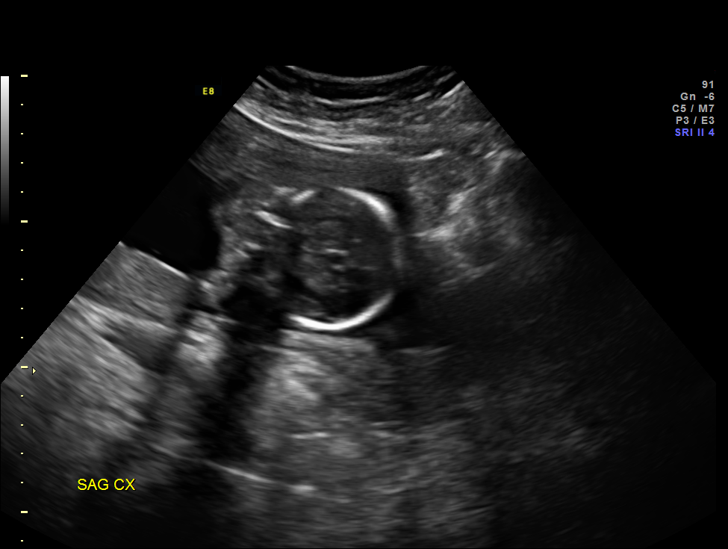
[im 11/25]
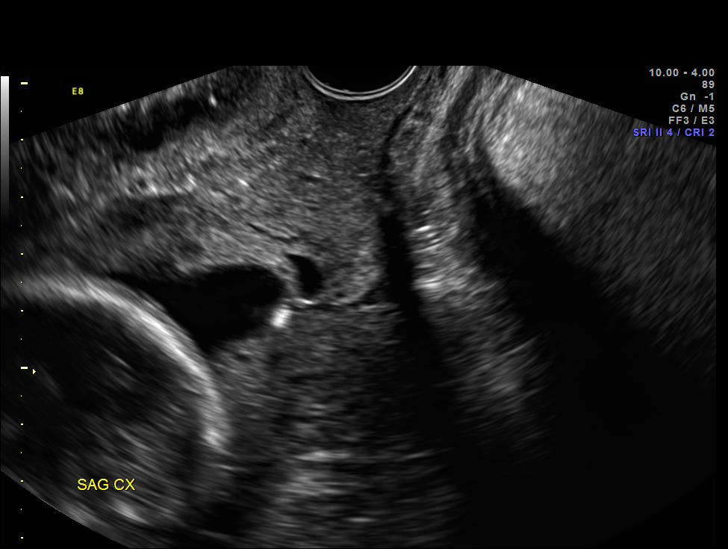
[im 13/25]
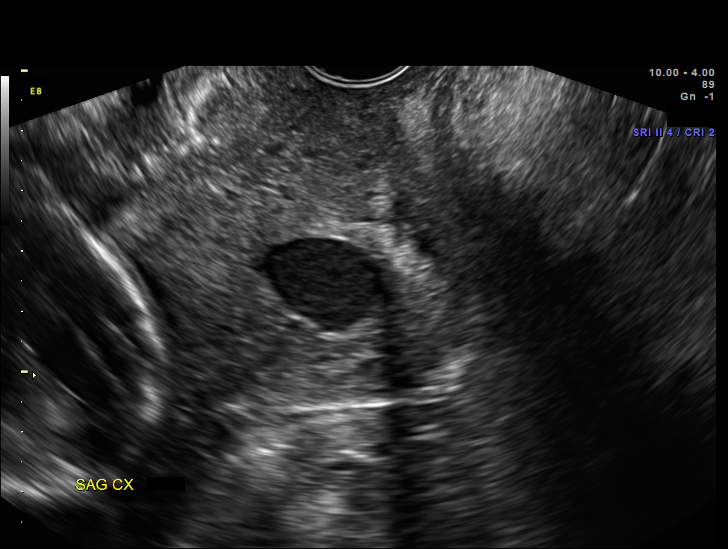
[im 15/25]
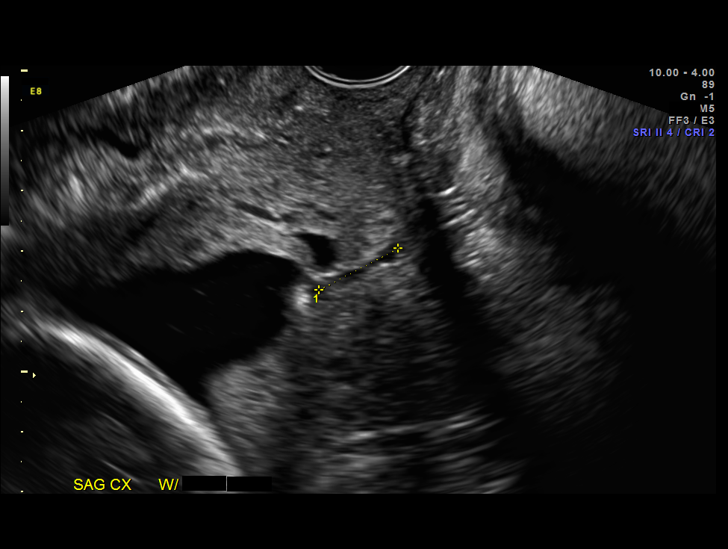
[im 17/25]
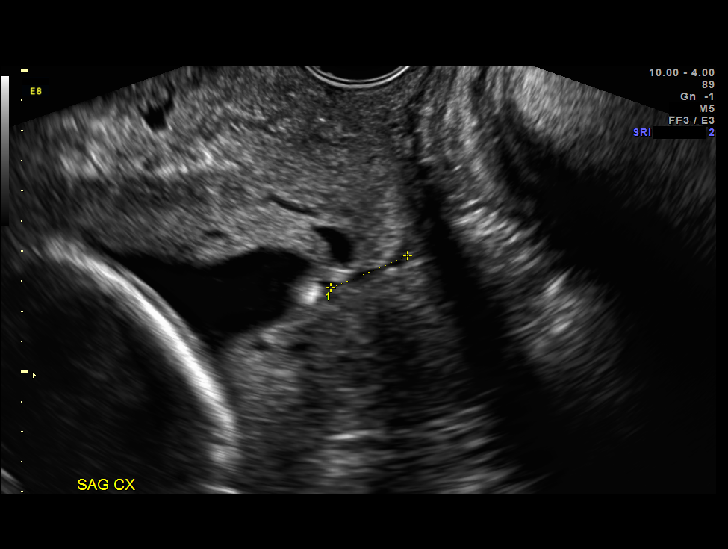
[im 19/25]
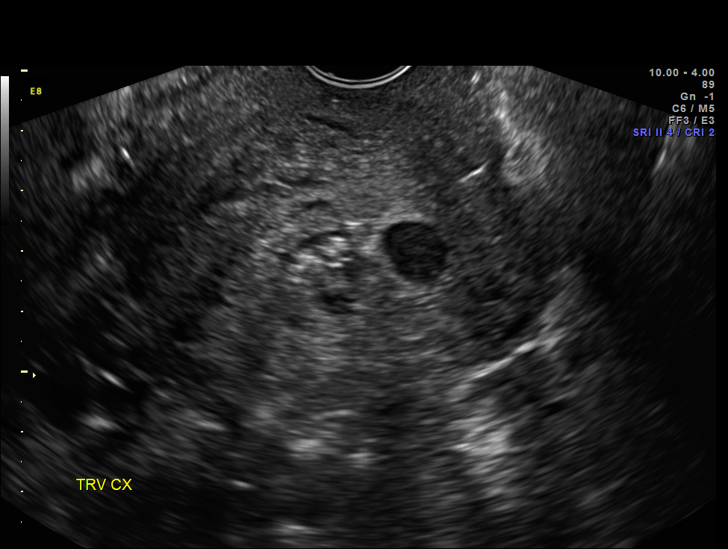
[im 21/25]
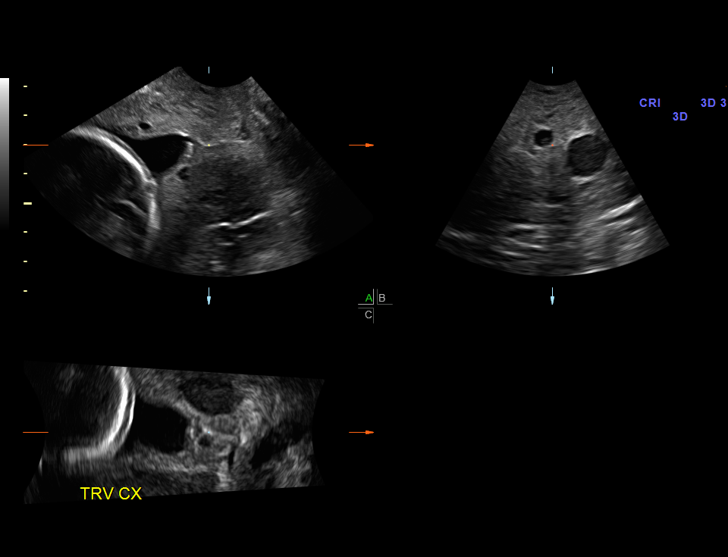
[im 23/25]
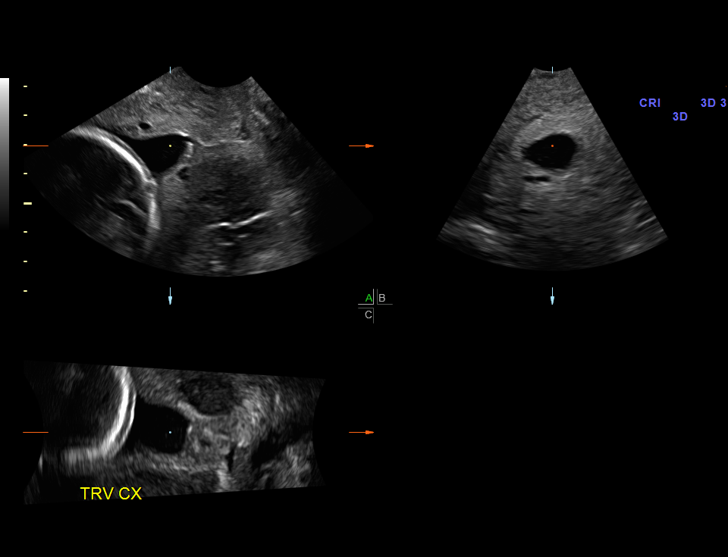
[im 25/25]
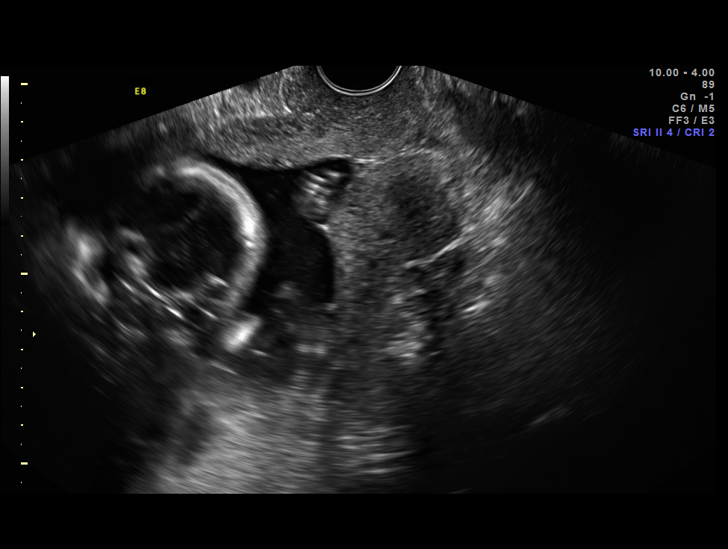

[13 of 25 positions shown; findings below may reference images not displayed]

OBSTETRICS REPORT
                      (Signed Final 04/25/2013 [DATE])

Service(s) Provided

 US OB TRANSVAGINAL                                    76817.0
Indications

 Cervical shortening
 Vaginal bleeding, unknown etiology
Fetal Evaluation

 Num Of Fetuses:    1
 Fetal Heart Rate:  138                          bpm
 Cardiac Activity:  Observed
 Presentation:      Cephalic
 Placenta:          Posterior Fundal, above
                    cervical os
 P. Cord            Visualized, central
 Insertion:

 Amniotic Fluid
 AFI FV:      Subjectively within normal limits
Gestational Age

 LMP:           18w 1d        Date:  12/19/12                 EDD:   09/25/13
 Best:          20w 6d     Det. By:  U/S (04/23/13)           EDD:   09/06/13
Cervix Uterus Adnexa

 Cervical Length:    1.3      cm

 Cervix:       Measured transvaginally. Funneling of internal os
               noted.
Comments

 Ms. Mannetjie returns for follow up.  She was recently
 admitted due to shortened cervix.  She has a history of 2 prior
 term deliveries without complications.  She reported some
 vaginal bleeding on admission that has since resolved.  She
 is currently on vaginal progesterone.  Do to vaginal spotting,
 the patient was not deemed to be a good candidate for
 physical exam indicated cerclage.

 The patient was counseld on cervical pessary - she elected to
 have a pessary placed - a #3 Cup Pessary was placed
 without difficulty.
Impression

 Single IUP at 20 [DATE] weeks
 Limited ultrasound performed due to shortened cervix

 TVUS - cevical length 1.3 cm with U-shaped funneling.  Some
 intraamniotic debris is again noted.
 (no significant change from admission)
Recommendations

 Recommend follow up next week for cervical length.
 Unless complications arise, would not remove pessary until
 36 weeks, PROM or delivery.

 questions or concerns.

## 2015-12-29 ENCOUNTER — Emergency Department (HOSPITAL_COMMUNITY)
Admission: EM | Admit: 2015-12-29 | Discharge: 2015-12-29 | Disposition: A | Discharge status: Home / Self Care | Payer: Self-pay | Attending: Emergency Medicine | Admitting: Emergency Medicine

## 2015-12-29 ENCOUNTER — Emergency Department (HOSPITAL_COMMUNITY): Payer: Self-pay

## 2015-12-29 ENCOUNTER — Encounter (HOSPITAL_COMMUNITY): Payer: Self-pay

## 2015-12-29 DIAGNOSIS — N390 Urinary tract infection, site not specified: Secondary | ICD-10-CM | POA: Insufficient documentation

## 2015-12-29 LAB — URINALYSIS, ROUTINE W REFLEX MICROSCOPIC
BILIRUBIN URINE: NEGATIVE
Glucose, UA: NEGATIVE mg/dL
KETONES UR: NEGATIVE mg/dL
NITRITE: POSITIVE — AB
PH: 5.5 (ref 5.0–8.0)
Protein, ur: 100 mg/dL — AB
Specific Gravity, Urine: 1.02 (ref 1.005–1.030)

## 2015-12-29 LAB — URINE MICROSCOPIC-ADD ON

## 2015-12-29 LAB — POC URINE PREG, ED: Preg Test, Ur: NEGATIVE

## 2015-12-29 MED ORDER — IBUPROFEN 400 MG PO TABS
600.0000 mg | ORAL_TABLET | Freq: Once | ORAL | Status: AC
  Administered 2015-12-29: 600 mg via ORAL
  Filled 2015-12-29: qty 2

## 2015-12-29 MED ORDER — ACETAMINOPHEN 500 MG PO TABS
1000.0000 mg | ORAL_TABLET | Freq: Once | ORAL | Status: AC
  Administered 2015-12-29: 1000 mg via ORAL
  Filled 2015-12-29: qty 2

## 2015-12-29 MED ORDER — CEPHALEXIN 500 MG PO CAPS: 500.0000 mg | ORAL_CAPSULE | Freq: Four times a day (QID) | ORAL | Status: DC

## 2015-12-29 MED ORDER — DEXTROSE 5 % IV SOLN
1.0000 g | Freq: Once | INTRAVENOUS | Status: AC
  Administered 2015-12-29: 1 g via INTRAVENOUS
  Filled 2015-12-29: qty 10

## 2015-12-29 NOTE — Discharge Instructions (Signed)
Infeccin urinaria  (Urinary Tract Infection)  La infeccin urinaria puede ocurrir en Corporate treasurercualquier lugar del tracto urinario. El tracto urinario es un sistema de drenaje del cuerpo por el que se eliminan los desechos y el exceso de [place]. El tracto urinario est formado por dos riones, dos urteres, la vejiga y Engineer, miningla uretra. Los riones son rganos que tienen forma de frijol. Cada rin tiene aproximadamente el tamao del puo. Estn situados debajo de las Haydencostillas, uno a cada lado de la columna vertebral CAUSAS  La causa de la infeccin son los microbios, que son organismos microscpicos, que incluyen hongos, virus, y bacterias. Estos organismos son tan pequeos que slo pueden verse a travs del microscopio. Las bacterias son los microorganismos que ms comnmente causan infecciones urinarias.  SNTOMAS  Los sntomas pueden variar segn la edad y el sexo del paciente y por la ubicacin de la infeccin. Los sntomas en las mujeres jvenes incluyen la necesidad frecuente e intensa de orinar y una sensacin dolorosa de ardor en la vejiga o en la uretra durante la miccin. Las mujeres y los hombres mayores podrn sentir cansancio, temblores y debilidad y Futures tradersentir dolores musculares y Engineer, miningdolor abdominal. Si tiene Zephyrfiebre, puede significar que la infeccin est en los riones. Otros sntomas son dolor en la espalda o en los lados debajo de las Mescalcostillas, nuseas y vmitos.  DIAGNSTICO  Para diagnosticar una infeccin urinaria, el mdico le preguntar acerca de sus sntomas. Genuine Partsambin le solicitar una Fredoniamuestra de Comorosorina. La muestra de orina se analiza para Engineer, manufacturingdetectar bacterias y glbulos blancos de Risk managerla sangre. Los glbulos blancos se forman en el organismo para ayudar a Artistcombatir las infecciones.  TRATAMIENTO  Por lo general, las infecciones urinarias pueden tratarse con medicamentos. Debido a que la Harley-Davidsonmayora de las infecciones son causadas por bacterias, por lo general pueden tratarse con antibiticos. La eleccin del  antibitico y la duracin del tratamiento depender de sus sntomas y el tipo de bacteria causante de la infeccin.  INSTRUCCIONES PARA EL CUIDADO EN EL HOGAR   Si le recetaron antibiticos, tmelos exactamente como su mdico le indique. Termine el medicamento aunque se sienta mejor despus de haber tomado slo algunos.  Beba gran cantidad de lquido para mantener la orina de tono claro o color amarillo plido.  Evite la cafena, el t y las [address] Hospital Placebebidas gaseosas. Estas sustancias irritan la vejiga.  Vaciar la vejiga con frecuencia. Evite retener la orina durante largos perodos.  Vace la vejiga antes y despus de Management consultanttener relaciones sexuales.  Despus de mover el intestino, las mujeres deben higienizarse la regin perineal desde adelante hacia atrs. Use slo un papel tissue por vez. SOLICITE ATENCIN MDICA SI:   Siente dolor en la espalda.  Le sube la fiebre.  Los sntomas no mejoran luego de [address] das. SOLICITE ATENCIN MDICA DE INMEDIATO SI:   Siente dolor intenso en la espalda o en la zona inferior del abdomen.  Comienza a sentir escalofros.  Tiene nuseas o vmitos.  Tiene una sensacin continua de quemazn o molestias al ConocoPhillipsorinar. ASEGRESE DE QUE:   Comprende estas instrucciones.  Controlar su enfermedad.  Solicitar ayuda de inmediato si no mejora o empeora.   Esta informacin no tiene Theme park managercomo fin reemplazar el consejo del mdico. Asegrese de hacerle al mdico cualquier pregunta que tenga.   Document Released: 05/31/2005 Document Revised: 05/15/2012 Elsevier Interactive Patient Education [employer]2016 Elsevier Inc.   Treat your fever with continued advil (I recommend 3 tablets or 600 mg every 6 hours if needed for fever  control).  This will also help with your headache and your body aches.  Take the entire course of the antibiotic.

## 2015-12-29 NOTE — ED Provider Notes (Signed)
CSN: [id number]     Arrival date & time 12/29/15  1440 History   First MD Initiated Contact with Patient 12/29/15 1459     Chief Complaint  Patient presents with  . Generalized Body Aches  . Fever     (Consider location/radiation/quality/duration/timing/severity/associated sxs/prior Treatment) The history is provided by the patient.   Leslie Braun is a 40 y.o. female with no significant past medical history presenting with a 2 day history of flu like symptoms including fever to 101, generalized body aches and a nonproductive cough and generalized headache.  She denies sore throat, nasal congestion or sinus pain, no chest pain or sob.  Her cough has been nonproductive.  She has had no abdominal pain, vomiting or diarrhea, but felt mildly nauseated prior to arrival which is now gone.  Denies dysuria, hematuria, vaginal discharge,  Flank pain, but feels a little achy in her right lower back. She denies rash or neck pain or stiffness.  She has had no foreign travel and denies any recent exposures to similar symptoms.  She has 2 friends who had the flu last month. She has taken advil 200 mg at 11:00 today without improvement in sx.  She has been tolerating PO fluids, appetite for solid food has been reduced.     Past Medical History  Diagnosis Date  . Seasonal allergies   . 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 Cholecystectomy     No family history on file. Social History  Substance Use Topics  . Smoking status: Never Smoker   . Smokeless tobacco: None  . Alcohol Use: No   OB History    Gravida Para Term Preterm AB TAB SAB Ectopic Multiple Living   3 3 2 1      3      Review of Systems  Constitutional: Positive for fever and chills.  HENT: Negative for congestion, ear pain, rhinorrhea and sore throat.   Eyes: Negative.   Respiratory: Positive for cough. Negative for chest tightness and shortness of breath.   Cardiovascular: Negative  for chest pain.  Gastrointestinal: Positive for nausea. Negative for abdominal pain.  Genitourinary: Negative.   Musculoskeletal: Negative for joint swelling, arthralgias and neck pain.  Skin: Negative.  Negative for rash and wound.  Neurological: Negative for dizziness, weakness, light-headedness, numbness and headaches.  Psychiatric/Behavioral: Negative.       Allergies  Review of patient's allergies indicates no known allergies.  Home Medications   Prior to Admission medications   Medication Sig Start Date End Date Taking? Authorizing Provider  acetaminophen-codeine (CAPITAL/CODEINE) 120-12 MG/5ML suspension 10 ml po q6h prn pain 08/21/13   Ivery QualeHobson Bryant, PA-C  amoxicillin (AMOXIL) 400 MG/5ML suspension Take 5 mLs (400 mg total) by mouth 3 (three) times daily. 08/21/13   Ivery QualeHobson Bryant, PA-C  ASPIRIN PO Take 1 tablet by mouth once as needed (for symptoms of pain).    Historical Provider, MD  cephALEXin (KEFLEX) 500 MG capsule Take 1 capsule (500 mg total) by mouth 4 (four) times daily. 12/29/15   Burgess AmorJulie Harli Engelken, PA-C  loratadine (CLARITIN) 10 MG tablet Take 10 mg by mouth daily as needed for allergies.    Historical Provider, MD  mupirocin cream (BACTROBAN) 2 % Apply to nose, lip and forehead tid 08/21/13   Ivery QualeHobson Bryant, PA-C  prednisoLONE (ORAPRED) 15 MG/5ML solution 30mg  po daily after a meal. 08/21/13   Ivery QualeHobson Bryant, PA-C  Prenatal Vit-Fe Fumarate-FA (PRENATAL MULTIVITAMIN) TABS tablet Take 1 tablet  by mouth daily at 12 noon. 04/23/13   Adam Phenix, MD   BP 138/73 mmHg  Pulse 107  Temp(Src) 101.5 F (38.6 C) (Oral)  Resp 16  Ht  (1.6 m)  Wt 81.647 kg  BMI 31.89 kg/m2  SpO2 99%  LMP 12/25/2015 Physical Exam  Constitutional: She is oriented to person, place, and time. She appears well-developed and well-nourished.  HENT:  Head: Normocephalic and atraumatic.  Right Ear: Tympanic membrane and ear canal normal.  Left Ear: Tympanic membrane and ear canal normal.  Nose:  No mucosal edema or rhinorrhea.  Mouth/Throat: Uvula is midline, oropharynx is clear and moist and mucous membranes are normal. Mucous membranes are not dry. No oropharyngeal exudate, posterior oropharyngeal edema, posterior oropharyngeal erythema or tonsillar abscesses.  Eyes: Conjunctivae are normal.  Neck: Full passive range of motion without pain. Neck supple.  Cardiovascular: Normal rate and normal heart sounds.   Pulmonary/Chest: Effort normal. No respiratory distress. She has no decreased breath sounds. She has no wheezes. She has rhonchi in the left lower field. She has no rales.  Rhonchi left base clears with cough.  Abdominal: Soft. Bowel sounds are normal. She exhibits no distension and no mass. There is no tenderness. There is no guarding.  Musculoskeletal: Normal range of motion.  Neurological: She is alert and oriented to person, place, and time.  Skin: Skin is warm and dry. No rash noted.  Psychiatric: She has a normal mood and affect.    ED Course  Procedures (including critical care time) Labs Review  Results for orders placed or performed during the hospital encounter of 12/29/15  Urinalysis, Routine w reflex microscopic (not at [facility])  Result Value Ref Range   Color, Urine YELLOW YELLOW   APPearance CLEAR CLEAR   Specific Gravity, Urine 1.020 1.005 - 1.030   pH 5.5 5.0 - 8.0   Glucose, UA NEGATIVE NEGATIVE mg/dL   Hgb urine dipstick LARGE (A) NEGATIVE   Bilirubin Urine NEGATIVE NEGATIVE   Ketones, ur NEGATIVE NEGATIVE mg/dL   Protein, ur 161 (A) NEGATIVE mg/dL   Nitrite POSITIVE (A) NEGATIVE   Leukocytes, UA SMALL (A) NEGATIVE  Urine microscopic-add on  Result Value Ref Range   Squamous Epithelial / LPF 0-5 (A) NONE SEEN   WBC, UA TOO NUMEROUS TO COUNT 0 - 5 WBC/hpf   RBC / HPF TOO NUMEROUS TO COUNT 0 - 5 RBC/hpf   Bacteria, UA MANY (A) NONE SEEN  POC urine preg, ED (not at [facility])  Result Value Ref Range   Preg Test, Ur NEGATIVE NEGATIVE   Dg Chest 2  View  12/29/2015  CLINICAL DATA:  Fever. EXAM: CHEST  2 VIEW COMPARISON:  None. FINDINGS: The heart size and mediastinal contours are within normal limits. Both lungs are clear. The visualized skeletal structures are unremarkable. IMPRESSION: No active cardiopulmonary disease. Electronically Signed   By: Lupita Raider, M.D.   On: 12/29/2015 16:08      Imaging Review Dg Chest 2 View  12/29/2015  CLINICAL DATA:  Fever. EXAM: CHEST  2 VIEW COMPARISON:  None. FINDINGS: The heart size and mediastinal contours are within normal limits. Both lungs are clear. The visualized skeletal structures are unremarkable. IMPRESSION: No active cardiopulmonary disease. Electronically Signed   By: Lupita Raider, M.D.   On: 12/29/2015 16:08   I have personally reviewed and evaluated these images and lab results as part of my medical decision-making.   EKG Interpretation None  MDM   Final diagnoses:  UTI (lower urinary tract infection)    Pt given additional ibuprofen here but with increased temp at 101.5 at recheck, pulse improved.  She was given tylenol.   5:04 PM Pt's urinalysis resulted with infected urine, nitrite +, tntc wbcs, rbc's, bacteria.  Culture ordered.  Pt given rocephin here, keflex for home.  Referral to [facility] to establish care, repeat UA after abx completed.  Advised recheck here for persistent fever, vomiting or any new or worse sx.    Burgess Amor, PA-C 12/29/15 1705    Burgess Amor, PA-C 12/29/15 1723  Raeford Razor, MD 01/05/16 [phone]

## 2015-12-29 NOTE — ED Notes (Signed)
Patient reports of generalized body aches and fevers since Monday. Has taken advil today at 1130.

## 2015-12-30 ENCOUNTER — Emergency Department (HOSPITAL_COMMUNITY)
Admission: EM | Admit: 2015-12-30 | Discharge: 2015-12-30 | Disposition: A | Discharge status: Home / Self Care | Payer: Medicaid Other

## 2015-12-30 NOTE — ED Notes (Signed)
Pt wanted information regarding how to take ibuprofen for her fever.  Pt had only been taking one 200mg  ibuprofen every 8 hours and was not keeping the fever down.  Pt was instructed at this time that she can take four 200 mg ibuprofen every 8 hours for fever.  Pt was asked if she wanted to be rechecked by the doctor and pt declined.

## 2015-12-31 LAB — URINE CULTURE: Culture: >=100000 — AB

## 2016-01-01 ENCOUNTER — Telehealth: Payer: Self-pay

## 2016-01-01 NOTE — Telephone Encounter (Signed)
Post ED Visit - Positive Culture Follow-up  Culture report reviewed by antimicrobial stewardship pharmacist:  []  Leslie Braun, Pharm.Braun. []  Leslie Braun, Pharm.Braun., BCPS []  Leslie Braun, Pharm.Braun. []  Leslie Braun, Pharm.Braun., BCPS []  Leslie Braun, Leslie Rainbow BoulevardPharm.Braun., BCPS, AAHIVP []  Leslie Braun, Pharm.Braun., BCPS, AAHIVP []  Leslie Braun, Pharm.Braun. []  Leslie Braun, Leslie Rainbow BoulevardPharm.Braun. Leslie Braun Positive urine culture Treated with Cephalexin, organism sensitive to the same and no further patient follow-up is required at this time.  Leslie Braun, Leslie Braun 01/01/2016, 9:53 AM

## 2017-01-22 ENCOUNTER — Emergency Department (HOSPITAL_COMMUNITY)
Admission: EM | Admit: 2017-01-22 | Discharge: 2017-01-22 | Disposition: A | Discharge status: Home / Self Care | Payer: Self-pay | Attending: Emergency Medicine | Admitting: Emergency Medicine

## 2017-01-22 ENCOUNTER — Encounter (HOSPITAL_COMMUNITY): Payer: Self-pay | Admitting: *Deleted

## 2017-01-22 ENCOUNTER — Emergency Department (HOSPITAL_COMMUNITY): Payer: Self-pay

## 2017-01-22 DIAGNOSIS — M542 Cervicalgia: Secondary | ICD-10-CM

## 2017-01-22 DIAGNOSIS — M5412 Radiculopathy, cervical region: Secondary | ICD-10-CM | POA: Insufficient documentation

## 2017-01-22 DIAGNOSIS — Z79899 Other long term (current) drug therapy: Secondary | ICD-10-CM | POA: Insufficient documentation

## 2017-01-22 MED ORDER — HYDROMORPHONE HCL 2 MG/ML IJ SOLN
2.0000 mg | Freq: Once | INTRAMUSCULAR | Status: AC
  Administered 2017-01-22: 2 mg via INTRAMUSCULAR
  Filled 2017-01-22: qty 1

## 2017-01-22 MED ORDER — NAPROXEN 500 MG PO TABS: 500.0000 mg | ORAL_TABLET | Freq: Two times a day (BID) | ORAL | 0 refills | Status: DC

## 2017-01-22 MED ORDER — CYCLOBENZAPRINE HCL 10 MG PO TABS: 10.0000 mg | ORAL_TABLET | Freq: Three times a day (TID) | ORAL | 0 refills | Status: DC | PRN

## 2017-01-22 MED ORDER — KETOROLAC TROMETHAMINE 30 MG/ML IJ SOLN
60.0000 mg | Freq: Once | INTRAMUSCULAR | Status: AC
  Administered 2017-01-22: 60 mg via INTRAMUSCULAR
  Filled 2017-01-22: qty 2

## 2017-01-22 MED ORDER — HYDROCODONE-ACETAMINOPHEN 5-325 MG PO TABS: 1.0000 | ORAL_TABLET | ORAL | 0 refills | Status: DC | PRN

## 2017-01-22 NOTE — ED Triage Notes (Signed)
Pt reports left sided shoulder pain that radiates into her left neck and down left arm into her fingers X 8 days.  Pt reports at times, she is unable to move her fingers.

## 2017-01-22 NOTE — ED Provider Notes (Signed)
[facility]-EM[facility]Y DEPT Provider Note   CSN: [id number] Arrival date & time: 01/22/17  0346     History   Chief Complaint Chief Complaint  Patient presents with  . Shoulder Pain    HPI Leslie Braun is a 41 y.o. female.  HPI  41 year old female presents with severe left neck and shoulder pain. Patient speaks Spanish only and the Spanish interpreter line was used. The patient has been having progressive atraumatic pain in her left neck and shoulder radiating down into her hands for the last 8 days. Became intolerable tonight, especially while trying to sleep. Any type of movement of her arm worsens the pain. There is no chest pain or shortness of breath. Causes her to have a headache as well. She has numbness and tingling in her fingers. Her arm hurts so much that is hard to move. There is no lower chimney symptoms. No bowel or bladder incontinence. She has taken aspirin with no relief. She has also tried a hot and cold oil without relief.  She has a hormonal implant in her left upper arm but pain is not around this site.  Past Medical History:  Diagnosis Date  . 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 Seasonal allergies     There are no active problems to display for this patient.   Past Surgical History:  Procedure Laterality Date  . CHOLECYSTECTOMY      OB History    Gravida Para Term Preterm AB Living   3 3 2 1   3    SAB TAB Ectopic Multiple Live Births           3       Home Medications    Prior to Admission medications   Medication Sig Start Date End Date Taking? Authorizing Provider  acetaminophen-codeine (CAPITAL/CODEINE) 120-12 MG/5ML suspension 10 ml po q6h prn pain 08/21/13  Yes Ivery Quale, PA-C  ASPIRIN PO Take 1 tablet by mouth once as needed (for symptoms of pain).   Yes [provider]  loratadine (CLARITIN) 10 MG tablet Take 10 mg by mouth daily as needed for allergies.   Yes [provider]  cyclobenzaprine (FLEXERIL) 10 MG tablet Take 1  tablet (10 mg total) by mouth 3 (three) times daily as needed for muscle spasms. 01/22/17   Pricilla Loveless, MD  HYDROcodone-acetaminophen (NORCO/VICODIN) 5-325 MG tablet Take 1-2 tablets by mouth every 4 (four) hours as needed for severe pain. 01/22/17   Pricilla Loveless, MD  naproxen (NAPROSYN) 500 MG tablet Take 1 tablet (500 mg total) by mouth 2 (two) times daily with a meal. 01/22/17   Pricilla Loveless, MD    Family History History reviewed. No pertinent family history.  Social History Social History  Substance Use Topics  . Smoking status: Never Smoker  . Smokeless tobacco: Never Used  . Alcohol use Yes     Comment: beer     Allergies   Patient has no known allergies.   Review of Systems Review of Systems  Constitutional: Negative for fever.  Respiratory: Negative for shortness of breath.   Cardiovascular: Negative for chest pain.  Musculoskeletal: Positive for arthralgias, myalgias and neck pain.  Neurological: Positive for numbness.  All other systems reviewed and are negative.    Physical Exam Updated Vital Signs BP (!) 119/108   Pulse 98   Temp 98.8 F (37.1 C) (Oral)   Resp (!) 21   Ht 5\' 5"  (1.651 m)   LMP 12/12/2016   SpO2 97%   Physical Exam  Constitutional: She is oriented to person, place, and time. She appears well-developed and well-nourished. She appears distressed (in severe pain).  HENT:  Head: Normocephalic and atraumatic.  Right Ear: External ear normal.  Left Ear: External ear normal.  Nose: Nose normal.  Eyes: Right eye exhibits no discharge. Left eye exhibits no discharge.  Neck: Normal range of motion. Neck supple. Muscular tenderness present. No spinous process tenderness present. No neck rigidity. Normal range of motion (but ROM to right causes pain) present.    Cardiovascular: Normal rate, regular rhythm and normal heart sounds.   Pulses:      Radial pulses are 2+ on the left side.  Pulmonary/Chest: Effort normal and breath sounds  normal.  Abdominal: Soft. There is no tenderness.  Musculoskeletal:       Left shoulder: She exhibits decreased range of motion and tenderness.  Neurological: She is alert and oriented to person, place, and time.  5/5 strength in RUE, RLE, LLE. Has moderate strength in LUE but this is severely limited due to pain. Decreased sensation over ulnar forearm  Skin: Skin is warm and dry. She is not diaphoretic.  Nursing note and vitals reviewed.    ED Treatments / Results  Labs (all labs ordered are listed, but only abnormal results are displayed) Labs Reviewed - No data to display  EKG  EKG Interpretation  Date/Time:  Monday Jan 22 2017 04:09:14 EDT Ventricular Rate:  102 PR Interval:    QRS Duration: 94 QT Interval:  352 QTC Calculation: 459 R Axis:   87 Text Interpretation:  Sinus tachycardia no acute ST/T changes no significant change since 2012 Confirmed by Pricilla LovelessGoldston, Magaly Pollina [phone]([id number]) on 01/22/2017 4:38:05 AM       Radiology Dg Shoulder Left  Result Date: 01/22/2017 CLINICAL DATA:  Left shoulder pain radiating into the neck and down arm. No known injury. EXAM: LEFT SHOULDER - 2+ VIEW COMPARISON:  None. FINDINGS: There is no evidence of fracture or dislocation. There is no evidence of arthropathy or other focal bone abnormality. Soft tissues are unremarkable. IMPRESSION: Negative radiographs of the left shoulder. Electronically Signed   By: Rubye OaksMelanie  Ehinger M.D.   On: 01/22/2017 05:54    Procedures Procedures (including critical care time)  Medications Ordered in ED Medications  HYDROmorphone (DILAUDID) injection 2 mg (2 mg Intramuscular Given 01/22/17 0511)  ketorolac (TORADOL) 30 MG/ML injection 60 mg (60 mg Intramuscular Given 01/22/17 0511)     Initial Impression / Assessment and Plan / ED Course  I have reviewed the triage vital signs and the nursing notes.  Pertinent labs & imaging results that were available during my care of the patient were reviewed by me and  considered in my medical decision making (see chart for details).     After IM pain medicine, patient now is feeling significantly better and has much better range of motion of her arm and shoulder. Full range of motion of her neck. Now on strength testing she has 5/5 strength in all major muscle groups in her upper extremities bilaterally. Feels much better. This is most likely a cervical radiculopathy. No MRI available at this time and I do not think it is emergently warranted given no focal weakness. The numbness and tingling indicate that she probably needs an urgent MRI. I have discussed that she is to follow closely with her PCP who she feels like she can follow-up at this week. I have also discussed strict return precautions. I will give her pain control as well as  muscle relaxers.  Final Clinical Impressions(s) / ED Diagnoses   Final diagnoses:  Cervical radiculopathy  Neck pain on left side    New Prescriptions New Prescriptions   CYCLOBENZAPRINE (FLEXERIL) 10 MG TABLET    Take 1 tablet (10 mg total) by mouth 3 (three) times daily as needed for muscle spasms.   HYDROCODONE-ACETAMINOPHEN (NORCO/VICODIN) 5-325 MG TABLET    Take 1-2 tablets by mouth every 4 (four) hours as needed for severe pain.   NAPROXEN (NAPROSYN) 500 MG TABLET    Take 1 tablet (500 mg total) by mouth 2 (two) times daily with a meal.     Pricilla Loveless, MD 01/22/17 [phone]

## 2017-01-22 NOTE — Discharge Instructions (Signed)
Do not drive, drink alcohol or operate heavy machinery while on the medicines prescribed to you today. If you develop worsening pain, weakness in the arm or legs, incontinence of your bowels or bladder, or other new or concerning symptoms, then return the ER immediately  No conduzca, beba alcohol ni opere maquinaria pesada mientras est tomando los medicamentos que le recetaron hoy. Si presenta un empeoramiento del dolor, debilidad en el brazo o las piernas, incontinencia de los intestinos o la vejiga u otros sntomas nuevos o preocupantes, devuelva el RE inmediatamente.

## 2017-04-13 ENCOUNTER — Emergency Department (HOSPITAL_COMMUNITY): Payer: Self-pay

## 2017-04-13 ENCOUNTER — Encounter (HOSPITAL_COMMUNITY): Payer: Self-pay

## 2017-04-13 ENCOUNTER — Emergency Department (HOSPITAL_COMMUNITY)
Admission: EM | Admit: 2017-04-13 | Discharge: 2017-04-13 | Disposition: A | Discharge status: Home / Self Care | Payer: Self-pay | Attending: Emergency Medicine | Admitting: Emergency Medicine

## 2017-04-13 ENCOUNTER — Emergency Department (HOSPITAL_BASED_OUTPATIENT_CLINIC_OR_DEPARTMENT_OTHER)
Admit: 2017-04-13 | Discharge: 2017-04-13 | Disposition: A | Discharge status: Home / Self Care | Payer: Medicaid Other | Attending: Emergency Medicine | Admitting: Emergency Medicine

## 2017-04-13 DIAGNOSIS — M7989 Other specified soft tissue disorders: Secondary | ICD-10-CM

## 2017-04-13 DIAGNOSIS — R51 Headache: Secondary | ICD-10-CM | POA: Insufficient documentation

## 2017-04-13 DIAGNOSIS — Z79899 Other long term (current) drug therapy: Secondary | ICD-10-CM | POA: Insufficient documentation

## 2017-04-13 DIAGNOSIS — N939 Abnormal uterine and vaginal bleeding, unspecified: Secondary | ICD-10-CM | POA: Insufficient documentation

## 2017-04-13 DIAGNOSIS — R519 Headache, unspecified: Secondary | ICD-10-CM

## 2017-04-13 DIAGNOSIS — Z9049 Acquired absence of other specified parts of digestive tract: Secondary | ICD-10-CM | POA: Insufficient documentation

## 2017-04-13 LAB — BASIC METABOLIC PANEL
Anion gap: 10 (ref 5–15)
BUN: 7 mg/dL (ref 6–20)
CALCIUM: 9.3 mg/dL (ref 8.9–10.3)
CO2: 20 mmol/L — ABNORMAL LOW (ref 22–32)
Chloride: 113 mmol/L — ABNORMAL HIGH (ref 101–111)
Creatinine, Ser: 0.5 mg/dL (ref 0.44–1.00)
GFR calc Af Amer: >60 mL/min (ref >60)
Glucose, Bld: 109 mg/dL — ABNORMAL HIGH (ref 65–99)
POTASSIUM: 3.6 mmol/L (ref 3.5–5.1)
Sodium: 143 mmol/L (ref 135–145)

## 2017-04-13 LAB — CBC WITH DIFFERENTIAL/PLATELET
BASOS ABS: 0.1 10*3/uL (ref 0.0–0.1)
Basophils Relative: 1 %
EOS PCT: 5 %
Eosinophils Absolute: 0.4 10*3/uL (ref 0.0–0.7)
HCT: 34.4 % — ABNORMAL LOW (ref 36.0–46.0)
Hemoglobin: 11.4 g/dL — ABNORMAL LOW (ref 12.0–15.0)
Lymphocytes Relative: 42 %
Lymphs Abs: 3.3 10*3/uL (ref 0.7–4.0)
MCH: 27.9 pg (ref 26.0–34.0)
MCHC: 33.1 g/dL (ref 30.0–36.0)
MCV: 84.3 fL (ref 78.0–100.0)
Monocytes Absolute: 0.3 10*3/uL (ref 0.1–1.0)
Monocytes Relative: 4 %
Neutro Abs: 3.9 10*3/uL (ref 1.7–7.7)
Neutrophils Relative %: 48 %
PLATELETS: 390 10*3/uL (ref 150–400)
RBC: 4.08 MIL/uL (ref 3.87–5.11)
RDW: 13.4 % (ref 11.5–15.5)
WBC: 7.9 10*3/uL (ref 4.0–10.5)

## 2017-04-13 LAB — I-STAT BETA HCG BLOOD, ED (MC, WL, AP ONLY): I-stat hCG, quantitative: <5 m[IU]/mL (ref <5)

## 2017-04-13 MED ORDER — KETOROLAC TROMETHAMINE 30 MG/ML IJ SOLN
30.0000 mg | Freq: Once | INTRAMUSCULAR | Status: AC
  Administered 2017-04-13: 30 mg via INTRAVENOUS
  Filled 2017-04-13: qty 1

## 2017-04-13 MED ORDER — IBUPROFEN 400 MG PO TABS: 400.0000 mg | ORAL_TABLET | Freq: Four times a day (QID) | ORAL | 0 refills | Status: DC | PRN

## 2017-04-13 MED ORDER — METHYLPREDNISOLONE SODIUM SUCC 125 MG IJ SOLR
125.0000 mg | Freq: Once | INTRAMUSCULAR | Status: AC
  Administered 2017-04-13: 125 mg via INTRAVENOUS
  Filled 2017-04-13: qty 2

## 2017-04-13 MED ORDER — METOCLOPRAMIDE HCL 5 MG/ML IJ SOLN
10.0000 mg | Freq: Once | INTRAMUSCULAR | Status: AC
  Administered 2017-04-13: 10 mg via INTRAVENOUS
  Filled 2017-04-13: qty 2

## 2017-04-13 MED ORDER — DIPHENHYDRAMINE HCL 50 MG/ML IJ SOLN
25.0000 mg | Freq: Once | INTRAMUSCULAR | Status: AC
  Administered 2017-04-13: 25 mg via INTRAVENOUS
  Filled 2017-04-13: qty 1

## 2017-04-13 NOTE — ED Provider Notes (Signed)
[facility] Provider Note   CSN: [id number] Arrival date & time: 04/13/17  0356     History   Chief Complaint Chief Complaint  Patient presents with  . Knee Pain  . Headache  . Vaginal Bleeding    HPI Leslie Braun is a 41 y.o. female.  The history is provided by the patient and the spouse. A language interpreter was used.  Knee Pain   This is a new problem. The current episode started more than 1 week ago. The problem occurs constantly. The pain is present in the right knee. The quality of the pain is described as aching and sharp. The pain is mild. Pertinent negatives include no numbness.  Headache   Pertinent negatives include no fever and no shortness of breath.  Vaginal Bleeding  Primary symptoms include vaginal bleeding.    Past Medical History:  Diagnosis Date  . 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 Seasonal allergies     There are no active problems to display for this patient.   Past Surgical History:  Procedure Laterality Date  . CHOLECYSTECTOMY      OB History    Gravida Para Term Preterm AB Living   3 3 2 1   3    SAB TAB Ectopic Multiple Live Births           3       Home Medications    Prior to Admission medications   Medication Sig Start Date End Date Taking? Authorizing Provider  acetaminophen-codeine (CAPITAL/CODEINE) 120-12 MG/5ML suspension 10 ml po q6h prn pain 08/21/13   Ivery Quale, PA-C  ASPIRIN PO Take 1 tablet by mouth once as needed (for symptoms of pain).    [provider]  cyclobenzaprine (FLEXERIL) 10 MG tablet Take 1 tablet (10 mg total) by mouth 3 (three) times daily as needed for muscle spasms. 01/22/17   Pricilla Loveless, MD  HYDROcodone-acetaminophen (NORCO/VICODIN) 5-325 MG tablet Take 1-2 tablets by mouth every 4 (four) hours as needed for severe pain. 01/22/17   Pricilla Loveless, MD  ibuprofen (ADVIL,MOTRIN) 400 MG tablet Take 1 tablet (400 mg total) by mouth every 6 (six) hours as needed. 04/13/17   Keyra Virella,  Barbara Cower, MD  loratadine (CLARITIN) 10 MG tablet Take 10 mg by mouth daily as needed for allergies.    [provider]  naproxen (NAPROSYN) 500 MG tablet Take 1 tablet (500 mg total) by mouth 2 (two) times daily with a meal. 01/22/17   Pricilla Loveless, MD    Family History No family history on file.  Social History Social History  Substance Use Topics  . Smoking status: Never Smoker  . Smokeless tobacco: Never Used  . Alcohol use Yes     Comment: beer     Allergies   Patient has no known allergies.   Review of Systems Review of Systems  Constitutional: Negative for fever.  Respiratory: Negative for shortness of breath.   Cardiovascular: Positive for leg swelling. Negative for chest pain.  Genitourinary: Positive for vaginal bleeding.  Musculoskeletal: Positive for arthralgias (especially right knee) and myalgias.  Neurological: Positive for headaches. Negative for numbness.  All other systems reviewed and are negative.    Physical Exam Updated Vital Signs BP (!) 107/53   Pulse 95   Temp 97.6 F (36.4 C) (Oral)   Resp 17   LMP 04/13/2017   SpO2 100%   Physical Exam  Constitutional: She is oriented to person, place, and time. She appears well-developed and well-nourished.  HENT:  Head: Normocephalic and atraumatic.  Eyes: Conjunctivae and EOM are normal.  Neck: Normal range of motion.  Cardiovascular: Normal rate and regular rhythm.   Pulmonary/Chest: No stridor. No respiratory distress.  Abdominal: She exhibits no distension.  Musculoskeletal: She exhibits edema and tenderness (posterior knee right).  Neurological: She is alert and oriented to person, place, and time. No cranial nerve deficit. Coordination normal.  Nursing note and vitals reviewed.    ED Treatments / Results  Labs (all labs ordered are listed, but only abnormal results are displayed) Labs Reviewed  CBC WITH DIFFERENTIAL/PLATELET - Abnormal; Notable for the following:       Result  Value   Hemoglobin 11.4 (*)    HCT 34.4 (*)    All other components within normal limits  BASIC METABOLIC PANEL - Abnormal; Notable for the following:    Chloride 113 (*)    CO2 20 (*)    Glucose, Bld 109 (*)    All other components within normal limits  I-STAT BETA HCG BLOOD, ED ([facility], [facility], [facility] ONLY)    EKG  EKG Interpretation None       Radiology No results found.  Procedures Procedures (including critical care time)  Medications Ordered in ED Medications  ketorolac (TORADOL) 30 MG/ML injection 30 mg (30 mg Intravenous Given 04/13/17 1039)  metoCLOPramide (REGLAN) injection 10 mg (10 mg Intravenous Given 04/13/17 1040)  diphenhydrAMINE (BENADRYL) injection 25 mg (25 mg Intravenous Given 04/13/17 1043)  methylPREDNISolone sodium succinate (SOLU-MEDROL) 125 mg/2 mL injection 125 mg (125 mg Intravenous Given 04/13/17 1042)     Initial Impression / Assessment and Plan / ED Course  I have reviewed the triage vital signs and the nursing notes.  Pertinent labs & imaging results that were available during my care of the patient were reviewed by me and considered in my medical decision making (see chart for details).    Currently being managed fro vaginal bleeding by gynecologist. No drop in Hb today. Started sprintec yesterday, doubt emergent condition will defer to gyn for same.  HA and knee pain a couple weeks after implanon given, possible clot? No h/o same but has signs of this possibility, will eval appropriately while treating symptoms.   Workupunremarkable. Needs to follow up with gyn. Feels better. Ambulates without difficulty. Neuro intact, stable for discharge.   Final Clinical Impressions(s) / ED Diagnoses   Final diagnoses:  Bad headache  Vaginal bleeding    New Prescriptions Discharge Medication List as of 04/13/2017 11:26 AM    START taking these medications   Details  ibuprofen (ADVIL,MOTRIN) 400 MG tablet Take 1 tablet (400 mg total) by mouth every 6 (six)  hours as needed., Starting Fri 04/13/2017, Print         Brena Windsor, Barbara CowerJason, MD 04/16/17 1431

## 2017-04-13 NOTE — Progress Notes (Signed)
**  Preliminary report by tech**  Right lower extremity venous duplex complete. There is no evidence of deep or superficial vein thrombosis involving the right lower extremity. All visualized vessels appear patent and compressible. There is no evidence of a Baker's cyst on the right. Results were given to Dr. Clayborne DanaMesner.  04/13/17 11:00 AM Olen CordialGreg Constant [place] RVT

## 2017-04-13 NOTE — ED Triage Notes (Signed)
Video interpreter used for triage. Pt has multiple complaints including headache, chills, leg pain, lower abdominal pain, and vaginal bleeding all x 2 weeks. PT reports having implant contraceptive placed in left arm ~1 month ago. She say obgn yesterday and was placed on oral contraceptives for vaginal bleeding. Denies nausea/vomiting/ diarrhea/ fevers. PT is restless in triage and keeps getting up walking around stating its because of the pain in her legs. PT endorses weakness.

## 2017-05-08 ENCOUNTER — Encounter (HOSPITAL_COMMUNITY): Payer: Self-pay | Admitting: *Deleted

## 2017-05-08 ENCOUNTER — Emergency Department (HOSPITAL_COMMUNITY): Payer: Self-pay

## 2017-05-08 ENCOUNTER — Emergency Department (HOSPITAL_COMMUNITY)
Admission: EM | Admit: 2017-05-08 | Discharge: 2017-05-08 | Disposition: A | Discharge status: Home / Self Care | Payer: Self-pay | Attending: Emergency Medicine | Admitting: Emergency Medicine

## 2017-05-08 DIAGNOSIS — Z9049 Acquired absence of other specified parts of digestive tract: Secondary | ICD-10-CM | POA: Insufficient documentation

## 2017-05-08 DIAGNOSIS — R0602 Shortness of breath: Secondary | ICD-10-CM

## 2017-05-08 DIAGNOSIS — R42 Dizziness and giddiness: Secondary | ICD-10-CM

## 2017-05-08 LAB — BASIC METABOLIC PANEL
ANION GAP: 11 (ref 5–15)
BUN: 7 mg/dL (ref 6–20)
CHLORIDE: 109 mmol/L (ref 101–111)
CO2: 18 mmol/L — ABNORMAL LOW (ref 22–32)
Calcium: 8.2 mg/dL — ABNORMAL LOW (ref 8.9–10.3)
Creatinine, Ser: 0.61 mg/dL (ref 0.44–1.00)
Glucose, Bld: 97 mg/dL (ref 65–99)
POTASSIUM: 3.3 mmol/L — AB (ref 3.5–5.1)
SODIUM: 138 mmol/L (ref 135–145)

## 2017-05-08 LAB — CBC
HEMATOCRIT: 34.9 % — AB (ref 36.0–46.0)
Hemoglobin: 11.2 g/dL — ABNORMAL LOW (ref 12.0–15.0)
MCH: 27.2 pg (ref 26.0–34.0)
MCHC: 32.1 g/dL (ref 30.0–36.0)
MCV: 84.7 fL (ref 78.0–100.0)
Platelets: 328 10*3/uL (ref 150–400)
RBC: 4.12 MIL/uL (ref 3.87–5.11)
RDW: 13.5 % (ref 11.5–15.5)
WBC: 8.1 10*3/uL (ref 4.0–10.5)

## 2017-05-08 LAB — I-STAT TROPONIN, ED: Troponin i, poc: 0 ng/mL (ref 0.00–0.08)

## 2017-05-08 NOTE — ED Notes (Signed)
Ambulated pt in hallway no difficulties denies sob. HR 88, o2 99%RA

## 2017-05-08 NOTE — Discharge Instructions (Signed)
Your workup is reassuring, your labs, EKG and chest x-ray were unremarkable and showed no evidence of heart or lung problems. These symptoms may be related to your Nexplanon like you suspect. Follow up with your primary doctor as planned. If you develop persistent chest pain, shortness of breath or leg swelling, or numbness or weakness return to the ED.

## 2017-05-08 NOTE — ED Notes (Addendum)
ED Provider at bedside with this nurse and interpreter [phone]

## 2017-05-08 NOTE — ED Triage Notes (Signed)
Per Spanish video interpreter, pt c/o , shivering, dizziness and sob onset at 2000 after eating dinner. Also reports an episode of Left sided chest pain radiating into L arm that has since resolved.

## 2017-05-08 NOTE — ED Provider Notes (Signed)
[facility]-EMERGENCY DEPT Provider Note   CSN: [id number] Arrival date & time: 05/08/17  0536     History   Chief Complaint Chief Complaint  Patient presents with  . Dizziness  . Shortness of Breath    HPI  Leslie Braun is a 41 y.o. Female with a history of iron deficiency anemia and seasonal allergies, who presents complaining of shortness of breath and dizziness which began yesterday evening while she was cooking dinner. She reports that she started to feel "funny". She denies any falling with dizziness and did not hit her head. Patient reports she had a similar feeling about a week ago, but it resolved on its own and seemed less intense. Patient reports she feels like her whole body is shaking, she reports numbness and tingling sensation in her left hand, denies any other focal weakness or numbness. Patient reports one episode of left sided chest pain during the night, which has resolved. She reports it is not associated with deep breaths. Denies current chest pain, fever, chills, abdominal pain, nausea or vomiting. Patient reports she got a new nexplanon about two months ago and thinks this may be the cause of her symptoms, she has an appointment on 9/11 to have nexplanon removed. She had increased bleeding with nexplanon and has been placed on OCP. Denies lower extremity pain or swelling, recent travel or immobilization, history of PE or DVT, family or personal history of bleeding or clotting disorders, cough or hemoptysis.  Patient is spanish speaking, [employer] interpreter used.       Past Medical History:  Diagnosis Date  . 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 Seasonal allergies     There are no active problems to display for this patient.   Past Surgical History:  Procedure Laterality Date  . CHOLECYSTECTOMY      OB History    Gravida Para Term Preterm AB Living   3 3 2 1   3    SAB TAB Ectopic Multiple Live Births           3       Home Medications    Prior to Admission  medications   Medication Sig Start Date End Date Taking? Authorizing Provider  acetaminophen-codeine (CAPITAL/CODEINE) 120-12 MG/5ML suspension 10 ml po q6h prn pain 08/21/13   Ivery QualeBryant, Hobson, PA-C  ASPIRIN PO Take 1 tablet by mouth once as needed (for symptoms of pain).    [provider]  cyclobenzaprine (FLEXERIL) 10 MG tablet Take 1 tablet (10 mg total) by mouth 3 (three) times daily as needed for muscle spasms. 01/22/17   Pricilla LovelessGoldston, Scott, MD  HYDROcodone-acetaminophen (NORCO/VICODIN) 5-325 MG tablet Take 1-2 tablets by mouth every 4 (four) hours as needed for severe pain. 01/22/17   Pricilla LovelessGoldston, Scott, MD  ibuprofen (ADVIL,MOTRIN) 400 MG tablet Take 1 tablet (400 mg total) by mouth every 6 (six) hours as needed. 04/13/17   Mesner, Barbara CowerJason, MD  loratadine (CLARITIN) 10 MG tablet Take 10 mg by mouth daily as needed for allergies.    [provider]  naproxen (NAPROSYN) 500 MG tablet Take 1 tablet (500 mg total) by mouth 2 (two) times daily with a meal. 01/22/17   Pricilla LovelessGoldston, Scott, MD    Family History No family history on file.  Social History Social History  Substance Use Topics  . Smoking status: Never Smoker  . Smokeless tobacco: Never Used  . Alcohol use Yes     Comment: beer     Allergies   Patient has no known allergies.  Review of Systems Review of Systems  Constitutional: Negative for chills and fever.  HENT: Negative for congestion, ear pain, rhinorrhea and sore throat.   Eyes: Negative for photophobia and visual disturbance.  Respiratory: Positive for shortness of breath. Negative for cough and chest tightness.   Cardiovascular: Positive for chest pain. Negative for palpitations.  Gastrointestinal: Negative for abdominal pain, nausea and vomiting.  Genitourinary: Negative for difficulty urinating and dyspareunia.  Musculoskeletal: Negative for myalgias.  Skin: Negative for pallor and rash.  Neurological: Positive for dizziness and numbness. Negative for  syncope, facial asymmetry, speech difficulty and weakness.       Left hand numbness, no falls     Physical Exam Updated Vital Signs BP (!) 180/79   Pulse 87   Temp 98.2 F (36.8 C)   Resp 20   LMP 04/13/2017   SpO2 100%   Physical Exam  Constitutional: She appears well-developed and well-nourished. No distress.  HENT:  Head: Normocephalic and atraumatic.  Eyes: Pupils are equal, round, and reactive to light. EOM are normal. Right eye exhibits no discharge. Left eye exhibits no discharge.  Neck: Neck supple.  Cardiovascular: Normal rate, regular rhythm, normal heart sounds and intact distal pulses.   Pulmonary/Chest: Effort normal and breath sounds normal. No respiratory distress.  Abdominal: Soft. Bowel sounds are normal. There is no tenderness. There is no guarding.  Musculoskeletal: She exhibits no edema or deformity.  Neurological: She is alert. Coordination normal.  Speech is clear, able to follow commands CN III-XII intact Normal strength in upper and lower extremities bilaterally including dorsiflexion and plantar flexion, strong and equal grip strength Sensation normal to light and sharp touch Moves extremities without ataxia, coordination intact Normal finger to nose and rapid alternating movements No pronator drift   Skin: Skin is warm and dry. Capillary refill takes less than 2 seconds. She is not diaphoretic.  Psychiatric: She has a normal mood and affect. Her behavior is normal.  Nursing note and vitals reviewed.    ED Treatments / Results  Labs (all labs ordered are listed, but only abnormal results are displayed) Labs Reviewed  BASIC METABOLIC PANEL - Abnormal; Notable for the following:       Result Value   Potassium 3.3 (*)    CO2 18 (*)    Calcium 8.2 (*)    All other components within normal limits  CBC - Abnormal; Notable for the following:    Hemoglobin 11.2 (*)    HCT 34.9 (*)    All other components within normal limits  I-STAT TROPONIN, ED      EKG  EKG Interpretation None       Radiology Dg Chest 2 View  Result Date: 05/08/2017 CLINICAL DATA:  Left arm pain, onset yesterday evening. EXAM: CHEST  2 VIEW COMPARISON:  12/29/2015 FINDINGS: The lungs are clear. The pulmonary vasculature is normal. Heart size is normal. Hilar and mediastinal contours are unremarkable. There is no pleural effusion. IMPRESSION: No active cardiopulmonary disease. Electronically Signed   By: Ellery Plunk M.D.   On: 05/08/2017 06:53    Procedures Procedures (including critical care time)  Medications Ordered in ED Medications - No data to display   Initial Impression / Assessment and Plan / ED Course  I have reviewed the triage vital signs and the nursing notes.  Pertinent labs & imaging results that were available during my care of the patient were reviewed by me and considered in my medical decision making (see chart for details).  Patient presents with shortness of breath and dizziness, hypertensive on initial evaluation no hypoxia or tachypnea or evidence of respiratory distress. EKG, labs and CXR are all unremarkable (globin 11.2, improved from previous), patient reports she feels better here currently, and attributes her symptoms to new nexplanon which she is having removed on 9/11. Not concerned for PE, no hypoxia, tachycardia, pleuritic CP, LE swelling, recent travel or surgeries. Patient ambulated successfully without hypoxia or tachycardia. BP improved on re-eval. Will discharge patient home, she has scheduled follow up with PCP, return precautions provided. Patient expressed understanding and is in agreement with plan.  Patient discussed with Dr. Jeraldine Loots, who is in agreement with plan.  Vitals:   05/08/17 0715 05/08/17 0753  BP: 137/80 (!) 149/96  Pulse: 81 86  Resp: 19 20  Temp:    SpO2: 99% 100%     Final Clinical Impressions(s) / ED Diagnoses   Final diagnoses:  Dizziness  Shortness of breath    New  Prescriptions Discharge Medication List as of 05/08/2017  8:09 AM       Dartha Lodge, PA-C 05/08/17 1628    Gerhard Munch, MD 05/09/17 [phone]

## 2017-05-08 NOTE — ED Notes (Signed)
VAN+

## 2018-08-11 ENCOUNTER — Observation Stay (HOSPITAL_COMMUNITY)
Admission: EM | Admit: 2018-08-11 | Discharge: 2018-08-11 | Disposition: A | Discharge status: Home / Self Care | Payer: Self-pay | Attending: Internal Medicine | Admitting: Internal Medicine

## 2018-08-11 ENCOUNTER — Emergency Department: Payer: Self-pay

## 2018-08-11 ENCOUNTER — Emergency Department (HOSPITAL_COMMUNITY): Admit: 2018-08-11 | Payer: Self-pay

## 2018-08-11 ENCOUNTER — Emergency Department (HOSPITAL_COMMUNITY): Payer: Self-pay

## 2018-08-11 ENCOUNTER — Encounter (HOSPITAL_COMMUNITY): Payer: Self-pay

## 2018-08-11 ENCOUNTER — Other Ambulatory Visit: Payer: Self-pay

## 2018-08-11 DIAGNOSIS — M1711 Unilateral primary osteoarthritis, right knee: Secondary | ICD-10-CM | POA: Insufficient documentation

## 2018-08-11 DIAGNOSIS — J302 Other seasonal allergic rhinitis: Secondary | ICD-10-CM | POA: Insufficient documentation

## 2018-08-11 DIAGNOSIS — L03115 Cellulitis of right lower limb: Principal | ICD-10-CM | POA: Diagnosis present

## 2018-08-11 DIAGNOSIS — Z79899 Other long term (current) drug therapy: Secondary | ICD-10-CM | POA: Insufficient documentation

## 2018-08-11 DIAGNOSIS — E669 Obesity, unspecified: Secondary | ICD-10-CM | POA: Insufficient documentation

## 2018-08-11 DIAGNOSIS — E6609 Other obesity due to excess calories: Secondary | ICD-10-CM | POA: Insufficient documentation

## 2018-08-11 DIAGNOSIS — Z6835 Body mass index (BMI) 35.0-35.9, adult: Secondary | ICD-10-CM

## 2018-08-11 DIAGNOSIS — Z6829 Body mass index (BMI) 29.0-29.9, adult: Secondary | ICD-10-CM | POA: Insufficient documentation

## 2018-08-11 LAB — COMPREHENSIVE METABOLIC PANEL
ALT: 19 U/L (ref 0–44)
ANION GAP: 6 (ref 5–15)
AST: 17 U/L (ref 15–41)
Albumin: 4.3 g/dL (ref 3.5–5.0)
Alkaline Phosphatase: 61 U/L (ref 38–126)
BILIRUBIN TOTAL: 0.6 mg/dL (ref 0.3–1.2)
BUN: 19 mg/dL (ref 6–20)
CO2: 22 mmol/L (ref 22–32)
Calcium: 9.1 mg/dL (ref 8.9–10.3)
Chloride: 109 mmol/L (ref 98–111)
Creatinine, Ser: 0.49 mg/dL (ref 0.44–1.00)
GFR calc Af Amer: >60 mL/min (ref >60)
Glucose, Bld: 117 mg/dL — ABNORMAL HIGH (ref 70–99)
POTASSIUM: 3.6 mmol/L (ref 3.5–5.1)
Sodium: 137 mmol/L (ref 135–145)
TOTAL PROTEIN: 7.4 g/dL (ref 6.5–8.1)

## 2018-08-11 LAB — CBC WITH DIFFERENTIAL/PLATELET
ABS IMMATURE GRANULOCYTES: 0.02 10*3/uL (ref 0.00–0.07)
BASOS ABS: 0.1 10*3/uL (ref 0.0–0.1)
BASOS PCT: 1 %
Eosinophils Absolute: 0.4 10*3/uL (ref 0.0–0.5)
Eosinophils Relative: 5 %
HCT: 39 % (ref 36.0–46.0)
HEMOGLOBIN: 12.4 g/dL (ref 12.0–15.0)
Immature Granulocytes: 0 %
LYMPHS PCT: 20 %
Lymphs Abs: 1.7 10*3/uL (ref 0.7–4.0)
MCH: 28 pg (ref 26.0–34.0)
MCHC: 31.8 g/dL (ref 30.0–36.0)
MCV: 88 fL (ref 80.0–100.0)
Monocytes Absolute: 0.5 10*3/uL (ref 0.1–1.0)
Monocytes Relative: 6 %
NEUTROS ABS: 5.5 10*3/uL (ref 1.7–7.7)
NEUTROS PCT: 68 %
NRBC: 0 % (ref 0.0–0.2)
PLATELETS: 365 10*3/uL (ref 150–400)
RBC: 4.43 MIL/uL (ref 3.87–5.11)
RDW: 12.3 % (ref 11.5–15.5)
WBC: 8.1 10*3/uL (ref 4.0–10.5)

## 2018-08-11 MED ORDER — VANCOMYCIN HCL IN DEXTROSE 1-5 GM/200ML-% IV SOLN
1000.0000 mg | Freq: Once | INTRAVENOUS | Status: AC
  Administered 2018-08-11: 1000 mg via INTRAVENOUS
  Filled 2018-08-11: qty 200

## 2018-08-11 MED ORDER — AMOXICILLIN-POT CLAVULANATE 875-125 MG PO TABS: 1.0000 | ORAL_TABLET | Freq: Two times a day (BID) | ORAL | 0 refills | Status: AC

## 2018-08-11 MED ORDER — IBUPROFEN 800 MG PO TABS: 800.0000 mg | ORAL_TABLET | Freq: Three times a day (TID) | ORAL | 0 refills | Status: DC | PRN

## 2018-08-11 MED ORDER — PIPERACILLIN-TAZOBACTAM 3.375 G IVPB 30 MIN
3.3750 g | Freq: Once | INTRAVENOUS | Status: AC
  Administered 2018-08-11: 3.375 g via INTRAVENOUS
  Filled 2018-08-11: qty 50

## 2018-08-11 MED ORDER — SODIUM CHLORIDE 0.9 % IV SOLN
Freq: Once | INTRAVENOUS | Status: AC
  Administered 2018-08-11: 08:00:00 via INTRAVENOUS

## 2018-08-11 NOTE — ED Notes (Signed)
Hospitalist at bedside 

## 2018-08-11 NOTE — ED Triage Notes (Signed)
Pt [facility], pt has bottle of clindamycin with instructions on label that read to take 3 pills daily x 7 days. Pt has done this. Wound is smaller, but pt noticed today that skin distal to wound was red and warm to touch with increased pain.

## 2018-08-11 NOTE — ED Notes (Signed)
EDP at bedside updating patient and family. 

## 2018-08-11 NOTE — ED Notes (Signed)
Notes indicate pt was given clindamycin with instructions to take 3 capsules 3 times daily, spouse and pt state pt has been taking 1 capsule 3 times daily; discussed med label, appears label was printed correctly and pt misunderstood the quantity

## 2018-08-11 NOTE — Discharge Summary (Signed)
Physician Discharge Summary  Leslie Braun ZOX:[id number] DOB: Dec 01, 1975 DOA: 08/11/2018  PCP: Patient, No Pcp Per  Admit date: 08/11/2018 Discharge date: 08/11/2018  Time spent: 45 minutes  Recommendations for Outpatient Follow-up:  1. Reassess right lower extremity wound and resolution of superimposed cellulitis.   Discharge Diagnoses:  Active Problems:   Cellulitis of right lower extremity Class II obesity Seasonal allergies  Discharge Condition: Stable and improved.  Patient discharged home with instruction to follow-up with PCP in 1 week.  Diet recommendation: Low calorie diet  Filed Weights   08/11/18 0206  Weight: 81.6 kg    History of present illness:  42 year old female with a past medical history significant for obesity and seasonal allergies; who in mid November ended experiencing a fall and trauma over wounds in the inner aspect of her right lower extremity creating a wound.  At that time she developed a bulges abscess that require incision and drainage and was placed on antibiotics.  Patient expressed that the wound was healing appropriately but in the last 24 hours prior to presentation she noticed some swelling redness and pain and decided to seek medical attention.  She expressed finishing already the antibiotics that she was prescribed; but after reviewing her bottles she has been taking less that the instructed medication (she was on clindamycin 3 tablets by mouth 3 times a day about 150 mg; patient does took 1 tablet 3 times a day).  She reported no fever, no nausea, no vomiting, no chest pain, no shortness of breath, no abdominal pain, no dysuria, no hematuria, no melena, no hematochezia, no diarrhea, no focal weakness or any other complaints.  In the ED patient found to be afebrile with normal WBCs and a CT scan of the affected area demonstrated no abscess or foreign body.  She received a dose of vancomycin and Zosyn and TR[facility] was consulted for evaluation to place  patient in observation for further management.  After reviewing the images, discussed with patient and following protocol for cellulitis patient can be safely treated as an outpatient with oral antibiotics and follow-up with her PCP.  (See below for details and treatment plan).  Hospital Course:  1-right lower extremity cellulitis: Patient with previous cellulitis/abscess in mid November that was appropriately treated with I&D and antibiotics. -Unfortunately patient has just partially taking her antibiotics and has developed mild superimposed cellulitis -She is not febrile, WBCs are within normal limits she is not having any nausea or vomiting -Overall wound with just mild serosanguineous drainage and a CT scan demonstrated no foreign body and no abscess. -Patient receive 1 dose of Zosyn and vancomycin while in the emergency department and will be treated with Augmentin twice a day for 10 days; instructed to apply cool compresses to assist with swelling and pain and to use ibuprofen. -She will follow-up with her PCP in 10 days -Instructions and information for red flags has been given; patient will continue wound care and avoid friction with clothes.   2-seasonal allergies -Continue Claritin -Stable at this time.  3-class II obesity -base on weight and height her BMI in the 35-35.9 range -Low calorie diet, portion control and increase physical activity has been discussed with patient.  Procedures:  See below for x-ray reports  Consultations:  None  Discharge Exam: Vitals:   08/11/18 0205 08/11/18 0245  BP:  130/75  Pulse:  94  Resp:  17  Temp: 98 F (36.7 C)   SpO2:  99%    General: Afebrile, no chest pain,  no shortness of breath, no nausea, no vomiting.  Patient reports some discomfort in her right lower extremity inner aspect, where previous wound is located. Cardiovascular: S1 and S2, no rubs, no gallops, no murmurs. Respiratory: Clear to auscultation bilaterally Abdomen:  Obese, no tenderness, no distention, positive bowel sounds Extremities/skin: Wound in the inner aspect of right eye almost reaching popliteal area, very little serosanguineous drainage appreciated, mild induration and surrounding erythema.  No fluctuation.  Good pulses.  There is no abnormalities in his left lower extremity and no other changes of concerns in her right leg. Neurology: Alert, awake and oriented x3; no focal deficits.  Discharge Instructions   Discharge Instructions    Diet - low sodium heart healthy   Complete by:  As directed    Discharge instructions   Complete by:  As directed    Keep yourself well hydrated Take medications as prescribed  Please arrange follow up with PCP in 10 days Apply cold compresses on affected area for 5 minutes four times a day. Continue wound care   Increase activity slowly   Complete by:  As directed      Allergies as of 08/11/2018   No Known Allergies     Medication List    STOP taking these medications   acetaminophen-codeine 120-12 MG/5ML suspension   ASPIRIN PO   cyclobenzaprine 10 MG tablet Commonly known as:  FLEXERIL   naproxen 500 MG tablet Commonly known as:  NAPROSYN     TAKE these medications   amoxicillin-clavulanate 875-125 MG tablet Commonly known as:  AUGMENTIN Take 1 tablet by mouth 2 (two) times daily for 10 days.   HYDROcodone-acetaminophen 5-325 MG tablet Commonly known as:  NORCO/VICODIN Take 1-2 tablets by mouth every 4 (four) hours as needed for severe pain.   ibuprofen 800 MG tablet Commonly known as:  ADVIL,MOTRIN Take 1 tablet (800 mg total) by mouth every 8 (eight) hours as needed for moderate pain. Please take schedule thee times a day for the next three days. What changed:    medication strength  how much to take  when to take this  reasons to take this  additional instructions   loratadine 10 MG tablet Commonly known as:  CLARITIN Take 10 mg by mouth daily as needed for  allergies.      No Known Allergies   The results of significant diagnostics from this hospitalization (including imaging, microbiology, ancillary and laboratory) are listed below for reference.    Significant Diagnostic Studies: Ct Femur Right Wo Contrast  Result Date: 08/11/2018 CLINICAL DATA:  Recently status post incision and drainage of wound in medial right knee. Recurrent pain, erythema, and tenderness. Evaluate for retained foreign body. EXAM: CT OF THE LOWER RIGHT EXTREMITY WITHOUT CONTRAST TECHNIQUE: Multidetector CT imaging of the right femur and knee was performed according to the standard protocol. No intravenous contrast administered. COMPARISON:  None. FINDINGS: Bones/Joint/Cartilage No evidence of fracture or other bone lesions. Mild medial compartment osteoarthritis seen in the knee. Ligaments Suboptimally assessed by CT. Muscles and Tendons Unremarkable. Soft tissues Ill-defined soft tissue density is seen in the subcutaneous tissues along the medial aspect of the knee joint, presumably at site of recent I and D. No definite abnormal fluid collection identified. No evidence of radiopaque foreign body. IMPRESSION: Ill-defined subcutaneous soft tissue density along medial aspect of right knee, which correlates with site of recent I and D. No evidence of radiopaque foreign body or drainable fluid collection. Mild medial compartment knee osteoarthritis. Electronically Signed  By: Myles RosenthalJohn  Stahl M.D.   On: 08/11/2018 08:24   Labs: Basic Metabolic Panel: Recent Labs  Lab 08/11/18 0430  NA 137  K 3.6  CL 109  CO2 22  GLUCOSE 117*  BUN 19  CREATININE 0.49  CALCIUM 9.1   Liver Function Tests: Recent Labs  Lab 08/11/18 0430  AST 17  ALT 19  ALKPHOS 61  BILITOT 0.6  PROT 7.4  ALBUMIN 4.3   CBC: Recent Labs  Lab 08/11/18 0430  WBC 8.1  NEUTROABS 5.5  HGB 12.4  HCT 39.0  MCV 88.0  PLT 365    Signed:  Vassie Lollarlos Kyndra Condron MD.  [facility] 08/11/2018, 9:28  AM

## 2018-08-11 NOTE — ED Provider Notes (Signed)
[facility] EMERGENCY DEPARTMENT Provider Note   CSN: [id number] Arrival date & time: 08/11/18  0133  Time seen 3:05 AM   History   Chief Complaint Chief Complaint  Patient presents with  . Wound Infection    HPI Leslie Braun is a 41 y.o. female.  HPI patient states she had a abscess on her right distal medial thigh that was initially treated at [facility] on November 14.  At that point she stated 5 days before was working on the steps of her house and slipped and fell and struck the medial aspect of her right thigh on a piece of wood.  She denied any injuries initially but then 3 days later she started develop was redness and swelling and pain over the area.  She had an I&D done and was given IV antibiotics.  She was transferred to [facility] to be seen by orthopedics.  They describe they explored the wound and determined it did not involve the knee joint.  She had a white count of 22.7 thousand at that time an x-ray of her knee did not show any soft tissue emphysema, fracture or bone destruction.  She also had an ultrasound done.  When she was transferred to [facility] on November 15 she was placed in the observation unit and received IV antibiotics.  They document she was seen daily for daily wound checks.  She was seen again at [facility] ED on November 21 when during 1 of her wound checks they thought maybe it was getting reinfected.  They did another x-ray and ultrasound which did not show any signs of osteomyelitis.  She was advised to finish her antibiotics.  Patient has a bottle of clindamycin 150 mg capsules that were started on November 16.  She had been prescribed 63 tablets but she was told to take 3 tablets a day for 7 days so she has lots of capsules left over.  She states she is only been taking 1 tablet 3 times a day.  Patient is here today because she started getting more pain and redness and swelling today that is extending lower down her leg.  She  denies any fever.  PCP Patient, No Pcp Per     Past Medical History:  Diagnosis Date  . 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 Seasonal allergies     Patient Active Problem List   Diagnosis Date Noted  . Cellulitis of right lower extremity 08/11/2018    Past Surgical History:  Procedure Laterality Date  . CHOLECYSTECTOMY       OB History    Gravida  3   Para  3   Term  2   Preterm  1   AB      Living  3     SAB      TAB      Ectopic      Multiple      Live Births  3            Home Medications    Prior to Admission medications   Medication Sig Start Date End Date Taking? Authorizing Provider  acetaminophen-codeine (CAPITAL/CODEINE) 120-12 MG/5ML suspension 10 ml po q6h prn pain 08/21/13   Ivery Quale, PA-C  ASPIRIN PO Take 1 tablet by mouth once as needed (for symptoms of pain).    [provider]  cyclobenzaprine (FLEXERIL) 10 MG tablet Take 1 tablet (10 mg total) by mouth 3 (three) times daily as needed for muscle  spasms. 01/22/17   Pricilla LovelessGoldston, Scott, MD  HYDROcodone-acetaminophen (NORCO/VICODIN) 5-325 MG tablet Take 1-2 tablets by mouth every 4 (four) hours as needed for severe pain. 01/22/17   Pricilla LovelessGoldston, Scott, MD  ibuprofen (ADVIL,MOTRIN) 400 MG tablet Take 1 tablet (400 mg total) by mouth every 6 (six) hours as needed. 04/13/17   Mesner, Barbara CowerJason, MD  loratadine (CLARITIN) 10 MG tablet Take 10 mg by mouth daily as needed for allergies.    [provider]  naproxen (NAPROSYN) 500 MG tablet Take 1 tablet (500 mg total) by mouth 2 (two) times daily with a meal. 01/22/17   Pricilla LovelessGoldston, Scott, MD    Family History No family history on file.  Social History Social History   Tobacco Use  . Smoking status: Never Smoker  . Smokeless tobacco: Never Used  Substance Use Topics  . Alcohol use: Yes    Comment: beer  . Drug use: No     Allergies   Patient has no known allergies.   Review of Systems Review of Systems  All other systems  reviewed and are negative.    Physical Exam Updated Vital Signs BP (!) 156/83   Pulse 98   Temp 98 F (36.7 C) (Oral)   Resp 17   Wt 81.6 kg   LMP 08/11/2018   SpO2 100%   BMI 29.94 kg/m   Vital signs normal    Physical Exam  Constitutional: She is oriented to person, place, and time. She appears well-developed and well-nourished.  HENT:  Head: Normocephalic and atraumatic.  Right Ear: External ear normal.  Left Ear: External ear normal.  Nose: Nose normal.  Eyes: EOM are normal.  Neck: Normal range of motion.  Cardiovascular: Normal rate.  Pulmonary/Chest: She is in respiratory distress.  Musculoskeletal: She exhibits edema.  Patient has a open area on her distal medial aspect of her thigh and the gauze that was over it has obviously been draining some brownish fluid.  She has diffuse swelling of her leg distal to that area with redness medially.  Neurological: She is alert and oriented to person, place, and time. No cranial nerve deficit.  Skin: Skin is warm and dry. Capillary refill takes less than 2 seconds. There is erythema.  Psychiatric: She has a normal mood and affect. Her behavior is normal. Thought content normal.  Nursing note and vitals reviewed.  Today    Today     Monday Dec 2    November 24      ED Treatments / Results  Labs (all labs ordered are listed, but only abnormal results are displayed) Results for orders placed or performed during the hospital encounter of 08/11/18  Comprehensive metabolic panel  Result Value Ref Range   Sodium 137 135 - 145 mmol/L   Potassium 3.6 3.5 - 5.1 mmol/L   Chloride 109 98 - 111 mmol/L   CO2 22 22 - 32 mmol/L   Glucose, Bld 117 (H) 70 - 99 mg/dL   BUN 19 6 - 20 mg/dL   Creatinine, Ser 1.610.49 0.44 - 1.00 mg/dL   Calcium 9.1 8.9 - 09.610.3 mg/dL   Total Protein 7.4 6.5 - 8.1 g/dL   Albumin 4.3 3.5 - 5.0 g/dL   AST 17 15 - 41 U/L   ALT 19 0 - 44 U/L   Alkaline Phosphatase 61 38 - 126 U/L   Total  Bilirubin 0.6 0.3 - 1.2 mg/dL   GFR calc non Af Amer >60 >60 mL/min   GFR calc Af  Amer >60 >60 mL/min   Anion gap 6 5 - 15  CBC with Differential  Result Value Ref Range   WBC 8.1 4.0 - 10.5 K/uL   RBC 4.43 3.87 - 5.11 MIL/uL   Hemoglobin 12.4 12.0 - 15.0 g/dL   HCT 21.3 08.6 - 57.8 %   MCV 88.0 80.0 - 100.0 fL   MCH 28.0 26.0 - 34.0 pg   MCHC 31.8 30.0 - 36.0 g/dL   RDW 46.9 62.9 - 52.8 %   Platelets 365 150 - 400 K/uL   nRBC 0.0 0.0 - 0.2 %   Neutrophils Relative % 68 %   Neutro Abs 5.5 1.7 - 7.7 K/uL   Lymphocytes Relative 20 %   Lymphs Abs 1.7 0.7 - 4.0 K/uL   Monocytes Relative 6 %   Monocytes Absolute 0.5 0.1 - 1.0 K/uL   Eosinophils Relative 5 %   Eosinophils Absolute 0.4 0.0 - 0.5 K/uL   Basophils Relative 1 %   Basophils Absolute 0.1 0.0 - 0.1 K/uL   Immature Granulocytes 0 %   Abs Immature Granulocytes 0.02 0.00 - 0.07 K/uL   Laboratory interpretation all normal    EKG None  Radiology No results found.  Procedures Procedures (including critical care time)  Medications Ordered in ED Medications  piperacillin-tazobactam (ZOSYN) IVPB 3.375 g (has no administration in time range)  vancomycin (VANCOCIN) IVPB 1000 mg/200 mL premix (0 mg Intravenous Stopped 08/11/18 0548)     Initial Impression / Assessment and Plan / ED Course  I have reviewed the triage vital signs and the nursing notes.  Pertinent labs & imaging results that were available during my care of the patient were reviewed by me and considered in my medical decision making (see chart for details).    Look at the patient's pictures, in November she had a huge bulging abscess about the size of a softball.  I suspect patient has a splinter in that area that has not been detected by ultrasound or x-ray.  I talked to the radiologist about if it is too late for a wood splinter to show up he states an ultrasound could help however she has had 2 ultrasounds already at [facility].  He states CT without  contrast would be better to look for foreign body, CT with contrast would be better to look for abscess so he recommends doing CT without contrast.  He states there is been no increase in information to do MRI.  This was discussed with the patient and she is agreeable.  Meanwhile she had an IV inserted and she got IV vancomycin.  There seem to be an issue with getting her CT read, when I finally get down to the reason there was an x-ray done at [facility] and here that have the same session number.  That needs to be straightened out before the radiologist will read the CT.  7:25 that is currently being done.   7:37 AM Dr. Gwenlyn Perking, hospitalist will come see patient.  Final Clinical Impressions(s) / ED Diagnoses   Final diagnoses:  Cellulitis of right lower extremity    Plan admission  Devoria Albe, MD, Concha Pyo, MD 08/11/18 [phone]

## 2018-08-27 ENCOUNTER — Other Ambulatory Visit (HOSPITAL_COMMUNITY): Payer: Self-pay

## 2019-07-10 ENCOUNTER — Other Ambulatory Visit (HOSPITAL_COMMUNITY): Payer: Self-pay | Admitting: Nurse Practitioner

## 2019-07-10 DIAGNOSIS — Z1231 Encounter for screening mammogram for malignant neoplasm of breast: Secondary | ICD-10-CM

## 2019-07-14 ENCOUNTER — Emergency Department (HOSPITAL_COMMUNITY)
Admission: EM | Admit: 2019-07-14 | Discharge: 2019-07-15 | Disposition: A | Discharge status: Home / Self Care | Payer: Self-pay | Attending: Emergency Medicine | Admitting: Emergency Medicine

## 2019-07-14 ENCOUNTER — Other Ambulatory Visit: Payer: Self-pay

## 2019-07-14 ENCOUNTER — Encounter (HOSPITAL_COMMUNITY): Payer: Self-pay | Admitting: *Deleted

## 2019-07-14 DIAGNOSIS — Z79899 Other long term (current) drug therapy: Secondary | ICD-10-CM | POA: Insufficient documentation

## 2019-07-14 DIAGNOSIS — I10 Essential (primary) hypertension: Secondary | ICD-10-CM | POA: Insufficient documentation

## 2019-07-14 MED ORDER — HYDROCHLOROTHIAZIDE 25 MG PO TABS
25.0000 mg | ORAL_TABLET | Freq: Every day | ORAL | Status: DC
  Administered 2019-07-15: 25 mg via ORAL
  Filled 2019-07-14: qty 1

## 2019-07-14 MED ORDER — HYDROCHLOROTHIAZIDE 25 MG PO TABS: 25.0000 mg | ORAL_TABLET | Freq: Every day | ORAL | 1 refills | Status: DC

## 2019-07-14 NOTE — ED Provider Notes (Signed)
[facility] EMERGENCY DEPARTMENT Provider Note   CSN: [id number] Arrival date & time: 07/14/19  2224     History   Chief Complaint Chief Complaint  Patient presents with  . Hypertension    HPI Leslie Braun is a 43 y.o. female.     The history is provided by the patient. No language interpreter was used.  Hypertension This is a new problem. The current episode started 12 to 24 hours ago. The problem occurs constantly. The problem has been gradually worsening. Pertinent negatives include no headaches. Nothing aggravates the symptoms. Nothing relieves the symptoms. She has tried nothing for the symptoms. The treatment provided no relief.  Pt complains of high blood pressure.  Pt is scheduled to see her MD in the am at 9:00.  Pt was seen at [facility] clinic today and told to come in because her blood pressure was high   Past Medical History:  Diagnosis Date  . 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 Seasonal allergies     Patient Active Problem List   Diagnosis Date Noted  . Cellulitis of right lower extremity 08/11/2018  . Class 2 obesity due to excess calories without serious comorbidity with body mass index (BMI) of 35.0 to 35.9 in adult   . Seasonal allergies     Past Surgical History:  Procedure Laterality Date  . CHOLECYSTECTOMY       OB History    Gravida  3   Para  3   Term  2   Preterm  1   AB      Living  3     SAB      TAB      Ectopic      Multiple      Live Births  3            Home Medications    Prior to Admission medications   Medication Sig Start Date End Date Taking? Authorizing Provider  HYDROcodone-acetaminophen (NORCO/VICODIN) 5-325 MG tablet Take 1-2 tablets by mouth every 4 (four) hours as needed for severe pain. 01/22/17   Sherwood Gambler, MD  ibuprofen (ADVIL,MOTRIN) 800 MG tablet Take 1 tablet (800 mg total) by mouth every 8 (eight) hours as needed for moderate pain. Please take schedule thee times a day for the next three days.  08/11/18   Barton Dubois, MD  loratadine (CLARITIN) 10 MG tablet Take 10 mg by mouth daily as needed for allergies.    [provider]    Family History History reviewed. No pertinent family history.  Social History Social History   Tobacco Use  . Smoking status: Never Smoker  . Smokeless tobacco: Never Used  Substance Use Topics  . Alcohol use: Yes    Comment: beer  . Drug use: No     Allergies   Patient has no known allergies.   Review of Systems Review of Systems  Neurological: Negative for headaches.  All other systems reviewed and are negative.    Physical Exam Updated Vital Signs BP (!) 163/118 (BP Location: Right Arm)   Pulse (!) 103   Temp 98.7 F (37.1 C) (Oral)   Resp 18   Wt 80.7 kg   LMP 07/10/2019   SpO2 100%   BMI 29.62 kg/m   Physical Exam Vitals signs and nursing note reviewed.  Constitutional:      Appearance: She is well-developed.  HENT:     Head: Normocephalic.     Nose: Nose normal.     Mouth/Throat:  Mouth: Mucous membranes are moist.  Eyes:     Pupils: Pupils are equal, round, and reactive to light.  Neck:     Musculoskeletal: Normal range of motion.  Cardiovascular:     Rate and Rhythm: Normal rate.  Pulmonary:     Effort: Pulmonary effort is normal.  Abdominal:     General: Abdomen is flat. There is no distension.  Musculoskeletal: Normal range of motion.  Skin:    General: Skin is warm.  Neurological:     General: No focal deficit present.     Mental Status: She is alert and oriented to person, place, and time.  Psychiatric:        Mood and Affect: Mood normal.      ED Treatments / Results  Labs (all labs ordered are listed, but only abnormal results are displayed) Labs Reviewed - No data to display  EKG None  Radiology No results found.  Procedures Procedures (including critical care time)  Medications Ordered in ED Medications  hydrochlorothiazide (HYDRODIURIL) tablet 25 mg (has no  administration in time range)     Initial Impression / Assessment and Plan / ED Course  I have reviewed the triage vital signs and the nursing notes.  Pertinent labs & imaging results that were available during my care of the patient were reviewed by me and considered in my medical decision making (see chart for details).        MDM  Pt given hctz here.  Rx for hctz.  Pt advised to see her MD for recheck as scheduled   Final Clinical Impressions(s) / ED Diagnoses   Final diagnoses:  Hypertension, unspecified type    ED Discharge Orders    None    An After Visit Summary was printed and given to the patient.    Elson Areas, Cordelia Poche 07/14/19 2345    Zadie Rhine, MD 07/15/19 Earle Gell

## 2019-07-14 NOTE — ED Triage Notes (Signed)
Pt states she went to the health clinic today for a physical and was told she has high blood pressure; pt states she has been having some sob and dizzy;

## 2019-07-14 NOTE — Discharge Instructions (Signed)
See your Physician as scheduled  

## 2019-07-21 ENCOUNTER — Ambulatory Visit (HOSPITAL_COMMUNITY)
Admission: RE | Admit: 2019-07-21 | Discharge: 2019-07-21 | Disposition: A | Discharge status: Home / Self Care | Payer: Self-pay | Source: Ambulatory Visit | Attending: Nurse Practitioner | Admitting: Nurse Practitioner

## 2019-07-21 ENCOUNTER — Other Ambulatory Visit: Payer: Self-pay

## 2019-07-21 DIAGNOSIS — Z1231 Encounter for screening mammogram for malignant neoplasm of breast: Secondary | ICD-10-CM | POA: Insufficient documentation

## 2023-03-21 DIAGNOSIS — Z139 Encounter for screening, unspecified: Secondary | ICD-10-CM

## 2023-03-21 LAB — GLUCOSE, POCT (MANUAL RESULT ENTRY): POC Glucose: 100 mg/dl — AB (ref 70–99)

## 2023-03-21 NOTE — Congregational Nurse Program (Signed)
  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 Yes    Student Assistance N/A    Location Patient Served  [facility]    Visit Setting with Client Organization    Insurance Uninsured ([employer]/Care Connects/Self-Pay/Medicaid Family Planning)    Insurance/Financial Assistance Referral [employer] Connects    Medication Made Appointment for Client;Referred to Medication Assistance    Medical Provider Yes    Screening Referrals Made N/A    Medical Referrals Made Non-[facility] P[facility]/Clinic   [facility]   Medical Appointment Made Non-[facility] PCP/clinic   [facility]   Recently w/o PCP, now 1st time PCP visit completed due to CNs referral or appointment made Yes    Food N/A    Transportation N/A    Housing/Utilities N/A    Interpersonal Safety N/A    Interventions Advocate/Support;Navigate Healthcare System    Abnormal to Normal Screening Since Last CN Visit N/A    Screenings CN Performed Blood Pressure;Blood Glucose;Temperature;Pulse Ox    Sent Client to Lab for: N/A    Did client attend any of the following based off CNs referral or appointments made? N/A    ED Visit Averted N/A    Life-Saving Intervention Made N/A

## 2023-03-21 NOTE — Congregational Nurse Program (Signed)
Pt attended [facility]/Care Connect Uninsured Program  to complete enrollment into the Care Connect program for uninsured.     Pt states PCP states she it been over  1 year since she last seen a PCP, which was at the [facility].    States she attempts to manage her BP by checking her bp readings at [employer], exercising and watching her food/sodium choices.   States she did take a BP pill (lisinopril-hydrochlorothiazide on this morning.   Chief Reasons Needed for PCP BP disease management  BP Meds Refill Blurry Vision - lost glasses  - requesting a vision exam referral Allergies currently bothering her with pressure in nasal area   Past /Current Medical History Hx of hypertension, seasonal allergies with medication, wore glasses but lost   Vital Signs Checked (see additional readings in vitals section of chart) Blood Pressure= ABN Blood Glucose Checked  (Random) 100 (last drank 11 am)= WNL    Socio-determinants health needs identified Food identified  (pt was assisted with shopping at on-site food market prior to leaving appt with Care Guide) PHQ9=  0   Plan -  Enrollment completed Care Connect Program by [employer] (Care Guide) - Care Connect Eligibile (7.17.24 thru 7.17.25) - MedAssist Application Initiated  and emailed  (pending approval) -[facility] Financial Assistance application on hold based on Medicaid decision approval letter -Pt was educated on the use of Urgent vs Emergency room in the event his PCP office is not available or open for business.     -Urgent vs emergency conditions were also reviewed with the patient  Scheduled Appointment for first medical visit was scheduled for March 26, 2023@  10:00am at the [facility]

## 2023-03-26 ENCOUNTER — Other Ambulatory Visit: Payer: Self-pay | Admitting: Physician Assistant

## 2023-03-26 ENCOUNTER — Ambulatory Visit: Payer: Self-pay | Admitting: Physician Assistant

## 2023-03-26 ENCOUNTER — Other Ambulatory Visit (HOSPITAL_COMMUNITY)
Admission: RE | Admit: 2023-03-26 | Discharge: 2023-03-26 | Disposition: A | Discharge status: Home / Self Care | Payer: Self-pay | Source: Ambulatory Visit | Attending: Physician Assistant | Admitting: Physician Assistant

## 2023-03-26 ENCOUNTER — Encounter: Payer: Self-pay | Admitting: Physician Assistant

## 2023-03-26 VITALS — BP 120/76 | HR 77 | Temp 98.2°F | Ht 63.5 in | Wt 181.0 lb

## 2023-03-26 DIAGNOSIS — Z1239 Encounter for other screening for malignant neoplasm of breast: Secondary | ICD-10-CM

## 2023-03-26 DIAGNOSIS — Z1211 Encounter for screening for malignant neoplasm of colon: Secondary | ICD-10-CM

## 2023-03-26 DIAGNOSIS — Z833 Family history of diabetes mellitus: Secondary | ICD-10-CM | POA: Insufficient documentation

## 2023-03-26 DIAGNOSIS — Z13220 Encounter for screening for lipoid disorders: Secondary | ICD-10-CM | POA: Insufficient documentation

## 2023-03-26 DIAGNOSIS — Z2821 Immunization not carried out because of patient refusal: Secondary | ICD-10-CM

## 2023-03-26 DIAGNOSIS — Z789 Other specified health status: Secondary | ICD-10-CM

## 2023-03-26 DIAGNOSIS — Z8679 Personal history of other diseases of the circulatory system: Secondary | ICD-10-CM

## 2023-03-26 DIAGNOSIS — Z131 Encounter for screening for diabetes mellitus: Secondary | ICD-10-CM

## 2023-03-26 DIAGNOSIS — Z7689 Persons encountering health services in other specified circumstances: Secondary | ICD-10-CM

## 2023-03-26 DIAGNOSIS — E669 Obesity, unspecified: Secondary | ICD-10-CM

## 2023-03-26 LAB — HEMOGLOBIN A1C
Hgb A1c MFr Bld: 5.2 % (ref 4.8–5.6)
Mean Plasma Glucose: 102.54 mg/dL

## 2023-03-26 LAB — COMPREHENSIVE METABOLIC PANEL
ALT: 21 U/L (ref 0–44)
AST: 18 U/L (ref 15–41)
Albumin: 3.9 g/dL (ref 3.5–5.0)
Alkaline Phosphatase: 52 U/L (ref 38–126)
Anion gap: 7 (ref 5–15)
BUN: 9 mg/dL (ref 6–20)
CO2: 21 mmol/L — ABNORMAL LOW (ref 22–32)
Calcium: 8.7 mg/dL — ABNORMAL LOW (ref 8.9–10.3)
Chloride: 107 mmol/L (ref 98–111)
Creatinine, Ser: 0.5 mg/dL (ref 0.44–1.00)
GFR, Estimated: >60 mL/min (ref >60)
Glucose, Bld: 96 mg/dL (ref 70–99)
Potassium: 3.6 mmol/L (ref 3.5–5.1)
Sodium: 135 mmol/L (ref 135–145)
Total Bilirubin: 1 mg/dL (ref 0.3–1.2)
Total Protein: 7.3 g/dL (ref 6.5–8.1)

## 2023-03-26 LAB — LIPID PANEL
Cholesterol: 156 mg/dL (ref 0–200)
HDL: 62 mg/dL (ref >40)
LDL Cholesterol: 72 mg/dL (ref 0–99)
Total CHOL/HDL Ratio: 2.5 RATIO
Triglycerides: 111 mg/dL (ref <150)
VLDL: 22 mg/dL (ref 0–40)

## 2023-03-26 NOTE — Progress Notes (Signed)
BP 120/76   Pulse 77   Temp 98.2 F (36.8 C)   Ht 5' 3.5" (1.613 m)   Wt 181 lb (82.1 kg)   SpO2 99%   BMI 31.56 kg/m    Subjective:    Patient ID: Leslie Braun, female    DOB: [birth year], 47 y.o.   MRN: [id number]  HPI: Leslie Braun is a 47 y.o. female presenting on 03/26/2023 for No chief complaint on file.   HPI   Chief Complaint  Patient presents with   New Patient (Initial Visit)    Pt is 47yoF who is in today to establish care.  Pt was going to [facility].   Pt says last visit was about a year ago.  Medication bottle dated 2022.  When asked why she is transferring care, She says she just wanted to try something different.  She takes bp med only sometimes.   She last took it 3 weeks ago.     She doesn't work.   She moved here from [place] 21 years ago. She did not get the covid vaccination.  She only complaint is she wants a vision appointment.  Her Last mammogram was 4 years ago.  She says she has history of negative biopsy. She says her Last pap was 4 years ago at [facility].    Relevant past medical, surgical, family and social history reviewed and updated as indicated. Interim medical history since our last visit reviewed. Allergies and medications reviewed and updated.   Current Outpatient Medications:    cetirizine (ZYRTEC) 10 MG tablet, Take 10 mg by mouth daily., Disp: , Rfl:    lisinopril-hydrochlorothiazide (ZESTORETIC) 20-25 MG tablet, Take 1 tablet by mouth daily., Disp: , Rfl:      Review of Systems  Per HPI unless specifically indicated above     Objective:    BP 120/76   Pulse 77   Temp 98.2 F (36.8 C)   Ht 5' 3.5" (1.613 m)   Wt 181 lb (82.1 kg)   SpO2 99%   BMI 31.56 kg/m   Wt Readings from Last 3 Encounters:  03/26/23 181 lb (82.1 kg)  07/14/19 178 lb (80.7 kg)  08/11/18 179 lb 14.3 oz (81.6 kg)    Physical Exam Vitals reviewed.  Constitutional:      General: She is not in acute distress.    Appearance: She is well-developed. She  is not toxic-appearing.  HENT:     Head: Normocephalic and atraumatic.     Right Ear: Tympanic membrane, ear canal and external ear normal.     Left Ear: Tympanic membrane, ear canal and external ear normal.     Mouth/Throat:     Mouth: Oropharynx is clear and moist.  Eyes:     Extraocular Movements: Extraocular movements intact and EOM normal.     Conjunctiva/sclera: Conjunctivae normal.     Pupils: Pupils are equal, round, and reactive to light.  Neck:     Thyroid: No thyromegaly.  Cardiovascular:     Rate and Rhythm: Normal rate and regular rhythm.  Pulmonary:     Effort: Pulmonary effort is normal.     Breath sounds: Normal breath sounds.  Abdominal:     General: Bowel sounds are normal.     Palpations: Abdomen is soft. There is no mass.     Tenderness: There is no abdominal tenderness.  Musculoskeletal:        General: No edema.     Cervical back: Neck supple.  Right lower leg: No edema.     Left lower leg: No edema.  Lymphadenopathy:     Cervical: No cervical adenopathy.  Skin:    General: Skin is warm and dry.  Neurological:     Mental Status: She is alert and oriented to person, place, and time.     Motor: No weakness or tremor.     Gait: Gait is intact. Gait normal.     Deep Tendon Reflexes:     Reflex Scores:      Patellar reflexes are 2+ on the right side and 2+ on the left side. Psychiatric:        Mood and Affect: Mood and affect normal.        Behavior: Behavior normal.          Assessment & Plan:   Encounter Diagnoses  Name Primary?   Encounter to establish care Yes   History of hypertension    Screening cholesterol level    Screening for colon cancer    Screening for diabetes mellitus    Family history of diabetes mellitus    Class 1 obesity with body mass index (BMI) of 31.0 to 31.9 in adult, unspecified obesity type, unspecified whether serious comorbidity present    Not proficient in English language    Encounter for screening for  malignant neoplasm of breast, unspecified screening modality    COVID-19 vaccination declined       -Pt is referred for screening Mammogram -requested Pap report from [facility] -will get baseline labs -pt is given FIT test for colon cancer screening -pt is counseled on need for Covid vaccination but she declined -discussed with pt that bp meds not to be taken prn.  Discussed that her bp is good so she she discontinue taking the lisinopril-hydrochlorothiazide prn -pt will follow up 1 month to review labs.  She is to contact office sooner prn

## 2023-03-26 NOTE — Patient Instructions (Addendum)
Recommendations for Everyone Aged 47 Years and Older Everyone aged 47 years and older ? should get 1 dose of an updated COVID-19 vaccine to protect against serious illness from COVID-19.   Recomendaciones para todos los [employer] de 5 aos Todas las personas [employer] de 5 aos  deben recibir 1 dosis de una vacuna contra el COVID-19 actualizada para protegerse contra enfermedades graves causadas por el COVID-19.

## 2023-04-04 ENCOUNTER — Encounter: Payer: Self-pay | Admitting: Physician Assistant

## 2023-04-24 ENCOUNTER — Other Ambulatory Visit: Payer: Self-pay | Admitting: Physician Assistant

## 2023-04-24 DIAGNOSIS — Z1231 Encounter for screening mammogram for malignant neoplasm of breast: Secondary | ICD-10-CM

## 2023-04-26 ENCOUNTER — Ambulatory Visit: Payer: Self-pay | Admitting: Physician Assistant

## 2023-05-21 ENCOUNTER — Inpatient Hospital Stay (HOSPITAL_COMMUNITY): Admission: RE | Admit: 2023-05-21 | Payer: Self-pay | Source: Ambulatory Visit
